# Patient Record
Sex: Female | Born: 1985 | Race: Black or African American | Hispanic: No | Marital: Single | Smoking: Current every day smoker
Health system: Southern US, Community
[De-identification: ages and names within clinical notes are randomized; demographics above are authoritative.]

## PROBLEM LIST (undated history)

## (undated) ENCOUNTER — Inpatient Hospital Stay (HOSPITAL_COMMUNITY): Payer: Self-pay

## (undated) DIAGNOSIS — A749 Chlamydial infection, unspecified: Secondary | ICD-10-CM

## (undated) DIAGNOSIS — D649 Anemia, unspecified: Secondary | ICD-10-CM

## (undated) DIAGNOSIS — F419 Anxiety disorder, unspecified: Secondary | ICD-10-CM

## (undated) DIAGNOSIS — F329 Major depressive disorder, single episode, unspecified: Secondary | ICD-10-CM

## (undated) DIAGNOSIS — Z789 Other specified health status: Secondary | ICD-10-CM

## (undated) DIAGNOSIS — F32A Depression, unspecified: Secondary | ICD-10-CM

## (undated) HISTORY — PX: INDUCED ABORTION: SHX677

---
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
## 2007-01-04 ENCOUNTER — Ambulatory Visit (HOSPITAL_COMMUNITY): Admission: RE | Admit: 2007-01-04 | Discharge: 2007-01-04 | Payer: Self-pay | Admitting: Obstetrics & Gynecology

## 2007-02-15 ENCOUNTER — Emergency Department (HOSPITAL_COMMUNITY): Admission: EM | Admit: 2007-02-15 | Discharge: 2007-02-15 | Payer: Self-pay | Admitting: Emergency Medicine

## 2007-02-18 ENCOUNTER — Inpatient Hospital Stay (HOSPITAL_COMMUNITY): Admission: AD | Admit: 2007-02-18 | Discharge: 2007-02-18 | Payer: Self-pay | Admitting: Obstetrics & Gynecology

## 2007-02-18 ENCOUNTER — Ambulatory Visit: Payer: Self-pay | Admitting: Obstetrics and Gynecology

## 2007-04-17 ENCOUNTER — Inpatient Hospital Stay (HOSPITAL_COMMUNITY): Admission: AD | Admit: 2007-04-17 | Discharge: 2007-04-17 | Payer: Self-pay | Admitting: Obstetrics & Gynecology

## 2007-05-11 ENCOUNTER — Inpatient Hospital Stay (HOSPITAL_COMMUNITY): Admission: AD | Admit: 2007-05-11 | Discharge: 2007-05-11 | Payer: Self-pay | Admitting: Obstetrics & Gynecology

## 2007-05-11 ENCOUNTER — Ambulatory Visit: Payer: Self-pay | Admitting: Advanced Practice Midwife

## 2007-05-16 ENCOUNTER — Inpatient Hospital Stay (HOSPITAL_COMMUNITY): Admission: AD | Admit: 2007-05-16 | Discharge: 2007-05-18 | Payer: Self-pay | Admitting: Obstetrics & Gynecology

## 2007-05-16 ENCOUNTER — Ambulatory Visit: Payer: Self-pay | Admitting: Obstetrics & Gynecology

## 2007-11-29 ENCOUNTER — Emergency Department (HOSPITAL_COMMUNITY): Admission: EM | Admit: 2007-11-29 | Discharge: 2007-11-29 | Payer: Self-pay | Admitting: Emergency Medicine

## 2008-05-24 ENCOUNTER — Emergency Department (HOSPITAL_COMMUNITY): Admission: EM | Admit: 2008-05-24 | Discharge: 2008-05-24 | Payer: Self-pay | Admitting: Emergency Medicine

## 2009-02-07 ENCOUNTER — Emergency Department (HOSPITAL_COMMUNITY): Admission: EM | Admit: 2009-02-07 | Discharge: 2009-02-07 | Payer: Self-pay | Admitting: Emergency Medicine

## 2009-02-22 ENCOUNTER — Emergency Department (HOSPITAL_COMMUNITY): Admission: EM | Admit: 2009-02-22 | Discharge: 2009-02-23 | Payer: Self-pay | Admitting: Emergency Medicine

## 2009-02-27 ENCOUNTER — Emergency Department (HOSPITAL_COMMUNITY): Admission: EM | Admit: 2009-02-27 | Discharge: 2009-02-27 | Payer: Self-pay | Admitting: Emergency Medicine

## 2009-06-08 ENCOUNTER — Emergency Department (HOSPITAL_COMMUNITY): Admission: EM | Admit: 2009-06-08 | Discharge: 2009-06-08 | Payer: Self-pay | Admitting: Emergency Medicine

## 2010-05-23 ENCOUNTER — Emergency Department (HOSPITAL_COMMUNITY): Admission: EM | Admit: 2010-05-23 | Discharge: 2010-05-23 | Payer: Self-pay | Admitting: Emergency Medicine

## 2010-07-09 ENCOUNTER — Emergency Department (HOSPITAL_COMMUNITY): Admission: EM | Admit: 2010-07-09 | Discharge: 2010-07-09 | Payer: Self-pay | Admitting: Emergency Medicine

## 2010-08-09 ENCOUNTER — Emergency Department (HOSPITAL_COMMUNITY): Admission: EM | Admit: 2010-08-09 | Discharge: 2010-08-09 | Payer: Self-pay | Admitting: Family Medicine

## 2010-08-21 ENCOUNTER — Emergency Department (HOSPITAL_COMMUNITY)
Admission: EM | Admit: 2010-08-21 | Discharge: 2010-08-21 | Payer: Self-pay | Source: Home / Self Care | Admitting: Emergency Medicine

## 2010-08-25 ENCOUNTER — Emergency Department (HOSPITAL_COMMUNITY)
Admission: EM | Admit: 2010-08-25 | Discharge: 2010-08-25 | Payer: Self-pay | Source: Home / Self Care | Admitting: Emergency Medicine

## 2010-12-03 LAB — POCT URINALYSIS DIPSTICK
Bilirubin Urine: NEGATIVE
Ketones, ur: NEGATIVE mg/dL
Nitrite: NEGATIVE
Specific Gravity, Urine: 1.01 (ref 1.005–1.030)
Urobilinogen, UA: 1 mg/dL (ref 0.0–1.0)
pH: >=9 (ref 5.0–8.0)

## 2010-12-03 LAB — GC/CHLAMYDIA PROBE AMP, GENITAL: GC Probe Amp, Genital: POSITIVE — AB

## 2010-12-03 LAB — POCT PREGNANCY, URINE: Preg Test, Ur: NEGATIVE

## 2010-12-03 LAB — WET PREP, GENITAL

## 2010-12-04 LAB — POCT I-STAT, CHEM 8
BUN: 9 mg/dL (ref 6–23)
Calcium, Ion: 1.17 mmol/L (ref 1.12–1.32)
Chloride: 106 mEq/L (ref 96–112)
Creatinine, Ser: 1 mg/dL (ref 0.4–1.2)
Glucose, Bld: 88 mg/dL (ref 70–99)
HCT: 41 % (ref 36.0–46.0)
Hemoglobin: 13.9 g/dL (ref 12.0–15.0)
Potassium: 3.7 mEq/L (ref 3.5–5.1)
Sodium: 139 mEq/L (ref 135–145)
TCO2: 25 mmol/L (ref 0–100)

## 2010-12-04 LAB — CBC
HCT: 35 % — ABNORMAL LOW (ref 36.0–46.0)
MCH: 29.5 pg (ref 26.0–34.0)
MCV: 88.4 fL (ref 78.0–100.0)
Platelets: 270 10*3/uL (ref 150–400)

## 2010-12-04 LAB — POCT PREGNANCY, URINE: Preg Test, Ur: NEGATIVE

## 2010-12-04 LAB — DIFFERENTIAL
Basophils Absolute: 0 10*3/uL (ref 0.0–0.1)
Eosinophils Absolute: 0.1 10*3/uL (ref 0.0–0.7)
Eosinophils Relative: 3 % (ref 0–5)
Lymphs Abs: 2 10*3/uL (ref 0.7–4.0)
Monocytes Absolute: 0.4 10*3/uL (ref 0.1–1.0)
Neutrophils Relative %: 53 % (ref 43–77)

## 2010-12-30 LAB — URINE MICROSCOPIC-ADD ON

## 2010-12-30 LAB — COMPREHENSIVE METABOLIC PANEL
ALT: 18 U/L (ref 0–35)
AST: 25 U/L (ref 0–37)
Alkaline Phosphatase: 57 U/L (ref 39–117)
BUN: 11 mg/dL (ref 6–23)
CO2: 21 mEq/L (ref 19–32)
GFR calc Af Amer: >60 mL/min (ref >60)
GFR calc non Af Amer: >60 mL/min (ref >60)
Glucose, Bld: 87 mg/dL (ref 70–99)
Potassium: 3.2 mEq/L — ABNORMAL LOW (ref 3.5–5.1)
Sodium: 139 mEq/L (ref 135–145)
Total Bilirubin: 0.7 mg/dL (ref 0.3–1.2)
Total Protein: 6.8 g/dL (ref 6.0–8.3)

## 2010-12-30 LAB — LIPASE, BLOOD: Lipase: 12 U/L (ref 11–59)

## 2010-12-30 LAB — CBC
HCT: 39.8 % (ref 36.0–46.0)
Hemoglobin: 13.3 g/dL (ref 12.0–15.0)
RBC: 4.48 MIL/uL (ref 3.87–5.11)

## 2010-12-30 LAB — DIFFERENTIAL
Basophils Absolute: 0 10*3/uL (ref 0.0–0.1)
Basophils Relative: 0 % (ref 0–1)
Eosinophils Absolute: 0 10*3/uL (ref 0.0–0.7)
Neutrophils Relative %: 89 % — ABNORMAL HIGH (ref 43–77)

## 2010-12-30 LAB — URINALYSIS, ROUTINE W REFLEX MICROSCOPIC
Glucose, UA: NEGATIVE mg/dL
Leukocytes, UA: NEGATIVE
Nitrite: NEGATIVE

## 2011-02-07 NOTE — Consult Note (Signed)
NAME:  Joanna Melton, Joanna Melton NO.:  [id number]   MEDICAL RECORD NO.:  [id number]          PATIENT TYPE:  EMS   LOCATION:  [facility]                         FACILITY:  [facility]   PHYSICIAN:  Roseanna Rainbow, M.D.DATE OF BIRTH:  Sep 13, 1986   DATE OF CONSULTATION:  09/30/2005  DATE OF DISCHARGE:  09/30/2005                                   CONSULTATION   REFERRING PHYSICIAN:  Tinnie Gens P. Caporossi, MD   REASON FOR CONSULTATION:  Asked to consult on this 25 year old African  American female, gravida 1, para 0, with rule-out ectopic pregnancy.   HISTORY OF PRESENT ILLNESS:  The patient's last menstrual period was on  August 24, 2005.  She presented to the [facility] Emergency Department with  complaints of lower abdominal cramping, suprapubic, earlier today.  She  denies any pain at present.  She has a history of gonorrhea.  Workup to date  included a quantitative beta hCG that was 739, a hemoglobin of 13 and an  ultrasound that demonstrated small free fluid in the posterior cul-de-sac,  no intrauterine pregnancy and a possible small hypoechoic mass near the left  ovary.   PAST OBSTETRICAL/GYNECOLOGICAL HISTORY:  Please see the above.   PAST MEDICAL HISTORY:  Noncontributory.   SOCIAL HISTORY:  She reports tobacco use of 1-2 packs per week.   FAMILY HISTORY:  Noncontributory.   PHYSICAL EXAMINATION:  VITAL SIGNS:  Temperature 99.2 degrees, blood  pressure 130/76, heart rate 106, respirations 20.  GENERAL:  A thin African American female in no apparent distress.  ABDOMEN:  Soft, nontender and non-distended.   ASSESSMENT:  Adolescent nullipara with an early pregnancy.  Differential  diagnosis includes a normal pregnancy, possible nonviable pregnancy,  possible ectopic pregnancy, risk factors including tobacco use and a  previous sexually transmitted infection.  She is hemodynamically stable.   PLAN:  I recommend discharge to home with pelvic rest, serial beta hCGs,  labs, with a repeat ultrasound when the beta hCG is at least 1500-2000 IU.  Ectopic pregnancy precautions.      Roseanna Rainbow, M.D.  Electronically Signed    LAJ/MEDQ  D:  09/30/2005  T:  10/01/2005  Job:  [id number]

## 2011-06-06 ENCOUNTER — Emergency Department (HOSPITAL_COMMUNITY)
Admission: EM | Admit: 2011-06-06 | Discharge: 2011-06-06 | Disposition: A | Discharge status: Home / Self Care | Payer: Self-pay | Attending: Emergency Medicine | Admitting: Emergency Medicine

## 2011-06-06 DIAGNOSIS — L2989 Other pruritus: Secondary | ICD-10-CM | POA: Insufficient documentation

## 2011-06-06 DIAGNOSIS — B86 Scabies: Secondary | ICD-10-CM | POA: Insufficient documentation

## 2011-06-06 DIAGNOSIS — R21 Rash and other nonspecific skin eruption: Secondary | ICD-10-CM | POA: Insufficient documentation

## 2011-06-06 DIAGNOSIS — L298 Other pruritus: Secondary | ICD-10-CM | POA: Insufficient documentation

## 2011-06-06 DIAGNOSIS — L259 Unspecified contact dermatitis, unspecified cause: Secondary | ICD-10-CM | POA: Insufficient documentation

## 2011-06-25 LAB — URINALYSIS, ROUTINE W REFLEX MICROSCOPIC
Ketones, ur: NEGATIVE
Leukocytes, UA: NEGATIVE
Nitrite: NEGATIVE
pH: 6.5

## 2011-06-25 LAB — WET PREP, GENITAL: WBC, Wet Prep HPF POC: NONE SEEN

## 2011-06-25 LAB — POCT PREGNANCY, URINE: Preg Test, Ur: NEGATIVE

## 2011-06-25 LAB — RPR: RPR Ser Ql: NONREACTIVE

## 2011-06-25 LAB — URINE MICROSCOPIC-ADD ON

## 2011-07-04 LAB — GC/CHLAMYDIA PROBE AMP, URINE: Chlamydia, Swab/Urine, PCR: NEGATIVE

## 2011-07-04 LAB — CBC
HCT: 32.7 — ABNORMAL LOW
Platelets: 232
WBC: 10.8 — ABNORMAL HIGH

## 2011-07-04 LAB — WET PREP, GENITAL

## 2011-07-04 LAB — RPR: RPR Ser Ql: NONREACTIVE

## 2011-07-07 LAB — STREP B DNA PROBE

## 2011-07-30 ENCOUNTER — Emergency Department (HOSPITAL_COMMUNITY): Payer: Self-pay

## 2011-07-30 ENCOUNTER — Emergency Department (HOSPITAL_COMMUNITY)
Admission: EM | Admit: 2011-07-30 | Discharge: 2011-07-30 | Disposition: A | Discharge status: Home / Self Care | Payer: Self-pay | Attending: Emergency Medicine | Admitting: Emergency Medicine

## 2011-07-30 DIAGNOSIS — S43016A Anterior dislocation of unspecified humerus, initial encounter: Secondary | ICD-10-CM

## 2011-07-30 DIAGNOSIS — M24419 Recurrent dislocation, unspecified shoulder: Secondary | ICD-10-CM | POA: Insufficient documentation

## 2011-07-30 DIAGNOSIS — M25519 Pain in unspecified shoulder: Secondary | ICD-10-CM | POA: Insufficient documentation

## 2011-07-30 MED ORDER — ONDANSETRON HCL 4 MG/2ML IJ SOLN
4.0000 mg | Freq: Once | INTRAMUSCULAR | Status: AC
  Administered 2011-07-30: 4 mg via INTRAVENOUS
  Filled 2011-07-30: qty 2

## 2011-07-30 MED ORDER — SODIUM CHLORIDE 0.9 % IV SOLN
Freq: Once | INTRAVENOUS | Status: AC
  Administered 2011-07-30: 1000 mL via INTRAVENOUS

## 2011-07-30 MED ORDER — PROPRANOLOL HCL 1 MG/ML IV SOLN
50.0000 mg | Freq: Once | INTRAVENOUS | Status: DC
  Filled 2011-07-30: qty 50

## 2011-07-30 MED ORDER — MORPHINE SULFATE 4 MG/ML IJ SOLN
6.0000 mg | Freq: Once | INTRAMUSCULAR | Status: AC
  Administered 2011-07-30: 6 mg via INTRAVENOUS
  Filled 2011-07-30: qty 2

## 2011-07-30 MED ORDER — PROPOFOL 10 MG/ML IV EMUL
INTRAVENOUS | Status: AC
  Administered 2011-07-30: 200 mg via INTRAVENOUS
  Filled 2011-07-30: qty 20

## 2011-07-30 MED ORDER — PROPOFOL 10 MG/ML IV EMUL
50.0000 mg | Freq: Once | INTRAVENOUS | Status: AC
  Administered 2011-07-30: 200 mg via INTRAVENOUS

## 2011-07-30 MED ORDER — HYDROCODONE-ACETAMINOPHEN 5-500 MG PO TABS: 1.0000 | ORAL_TABLET | Freq: Four times a day (QID) | ORAL | Status: AC | PRN

## 2011-07-30 MED ORDER — SODIUM CHLORIDE 0.9 % IV SOLN
INTRAVENOUS | Status: DC
  Administered 2011-07-30: 1000 mL via INTRAVENOUS

## 2011-07-30 MED ORDER — SODIUM CHLORIDE 0.9 % IV SOLN
INTRAVENOUS | Status: DC | PRN
  Administered 2011-07-30: 1000 mL via INTRAVENOUS

## 2011-07-30 NOTE — ED Notes (Signed)
Ortho has placed left shoulder in sling.

## 2011-07-30 NOTE — ED Notes (Signed)
Pt presents with L shoulder pain after awakening with pain this morning. Pt reports h/o mulitple shoulder dislocations.  PIV established PTA to R FA , pt received fentanyl IVP,  LUE splinted PTA.

## 2011-07-30 NOTE — ED Notes (Signed)
Pt presents with L shoulder pain and deformity after awakening with pain this morning.  Deformity noted to top of shoulder, +radial pulse, pt unable to move arm; pt reports h/o multiple dislocations.

## 2011-07-30 NOTE — ED Notes (Signed)
Dr Hyman Hopes remains at bedside, orders received for 1000cc bolus for blood pressure

## 2011-07-30 NOTE — ED Notes (Signed)
Pt received from xray. Pt reports hx of mult dislocations to left shoulder, pt reports increase in pain at this time because of xrays. Pt is aware of poc. Will given pt swap for dry mouth. Pulse good. Pt able to move left fingers. Pt has fallen during hanging of the fluids.

## 2011-07-30 NOTE — Discharge Instructions (Signed)
Shoulder Dislocation Shoulder dislocations are an injury to the shoulder joint in which the shoulder has been forced out of the socket. This may result in injury to muscles and ligaments around this joint. HOME CARE INSTRUCTIONS   If a sling or splint was applied, wear as directed. It can be taken off to bathe unless directed otherwise.   Apply ice bags (ice in a plastic bag with a towel around it to prevent frostbite to skin) about 3 to 4 times a day for 15 to 20 minutes, while awake, to the injured area for the first 24 hours, then as directed by your caregiver.   Only take over-the-counter or prescription medicines for pain, discomfort, or fever as directed by your caregiver.  Persistent pain and inability to use the injured area for more than 2 to 3 days are warning signs indicating that you should see a caregiver for a follow-up visit as soon as possible. Initially, a hairline fracture (this is the same as a broken bone) may not be evident on X-rays. Persistent pain and swelling indicate that further evaluation and/or further X-rays may be indicated. X-rays sometimes do not show a small fracture until a week to ten days later. Make a follow-up appointment with your own caregiver or one to whom we have referred you, it is very important to keep that appointment. Not keeping the appointment could result in a chronic or permanent injury, pain, and disability. If there is any problem keeping the appointment, you must call back to this facility for assistance. A radiologist (a specialist in reading X-rays) will re-read your x-rays. Make sure you know how you are to obtain your x-ray results. Do not assume everything is normal if you do not hear from [place]. SEEK IMMEDIATE MEDICAL CARE IF:  You develop severe swelling, bluish discoloration, coldness, numbness, or excessive pain in the shoulder, arm, or hand. MAKE SURE YOU:   Understand these instructions.   Will watch your condition.   Will get help  right away if you are not doing well or get worse.  Document Released: 06/03/2001 Document Revised: 05/21/2011 Document Reviewed: 04/13/2008 [facility] Patient Information 2012 [place], [place].

## 2011-07-30 NOTE — ED Provider Notes (Addendum)
History     CSN: [id number] Arrival date & time: 07/30/2011 12:23 PM   First MD Initiated Contact with Patient 07/30/11 1232      Chief Complaint  Patient presents with  . Shoulder Pain    (Consider location/radiation/quality/duration/timing/severity/associated sxs/prior treatment) HPI  History reviewed. No pertinent past medical history.  History reviewed. No pertinent past surgical history.  History reviewed. No pertinent family history.  History  Substance Use Topics  . Smoking status: Not on file  . Smokeless tobacco: Not on file  . Alcohol Use: No    OB History    Grav Para Term Preterm Abortions TAB SAB Ect Mult Living                  Review of Systems  Allergies  Review of patient's allergies indicates no known allergies.  Home Medications  No current outpatient prescriptions on file.  BP 106/69  Pulse 59  Temp(Src) 98 F (36.7 C) (Oral)  Resp 16  SpO2 98%  LMP 07/20/2011  Physical Exam  ED Course  Procedures (including critical care time)  Labs Reviewed - No data to display No results found.   No diagnosis found.    MDM  Medical screening exam :Patient with recurrent shoulder dislocations. Awakened today with left shoulder pain. On exam alert awake Glasgow Coma Score 15 deformity of left shoulder. She is unable to move the shoulder in any direction due to pain. Radial pulse 2+ good sensation of the shoulder Emblem area        Doug Sou, MD 07/30/11 1241  Doug Sou, MD 07/30/11 1245

## 2011-08-03 IMAGING — CR DG SHOULDER 2+V*L*
1 series · 1 of 1 positions shown · non-contrast
Comparison: Two-view left shoulder of the same day at [DATE] p.m.

CLINICAL DATA: Status post reduction.

LEFT SHOULDER - 2+ VIEW

[view not recorded]
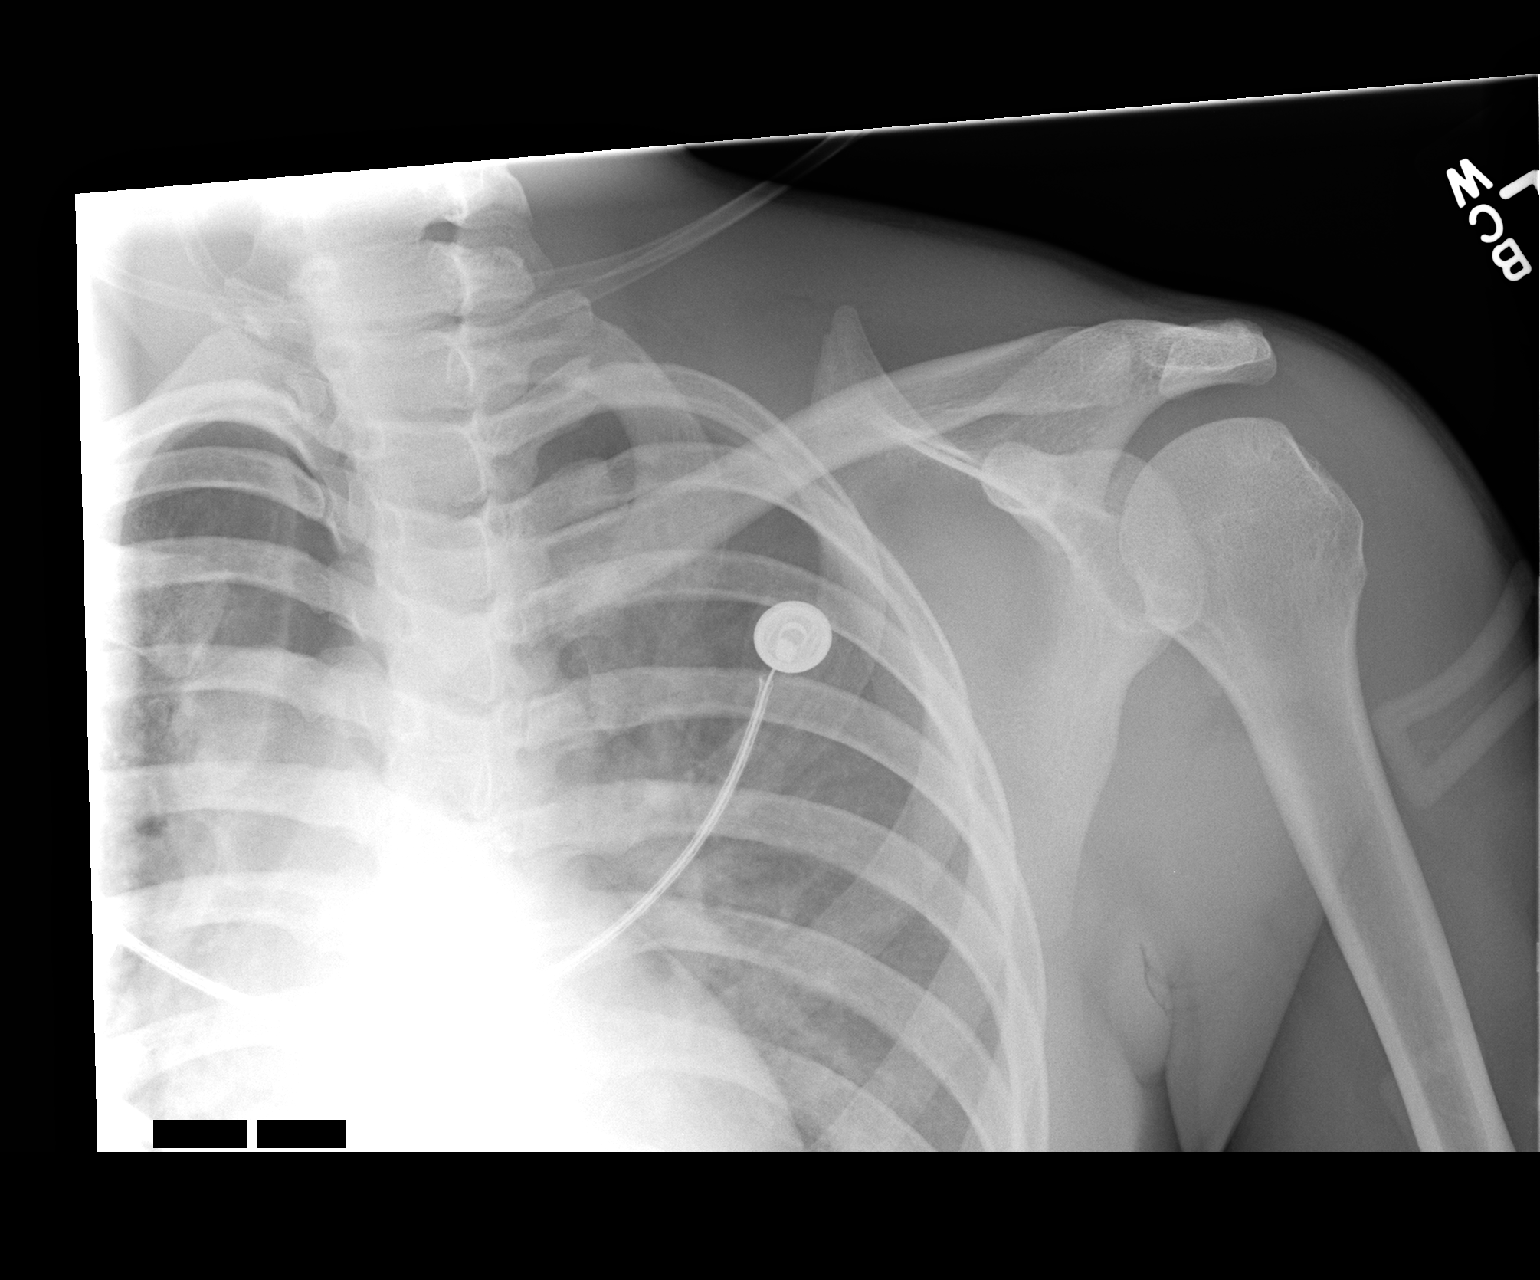

[1 of 1 positions shown; findings below may reference images not displayed]

FINDINGS: The left shoulder is located.  A chronic Hill-Sachs
lesion is suspected.  The lung volumes are low.
IMPRESSION: 1.  Status post reduction of the left shoulder.
2.  Suspect chronic Hill-Sachs fracture.

## 2011-08-07 IMAGING — CR DG SHOULDER 1V*L*
1 series · 1 of 1 positions shown · non-contrast
Comparison: 08/21/2010 radiographs.

CLINICAL DATA: Shoulder dislocation post reduction.

PORTABLE LEFT SHOULDER - 2+ VIEW

[ap int/ext rotation]
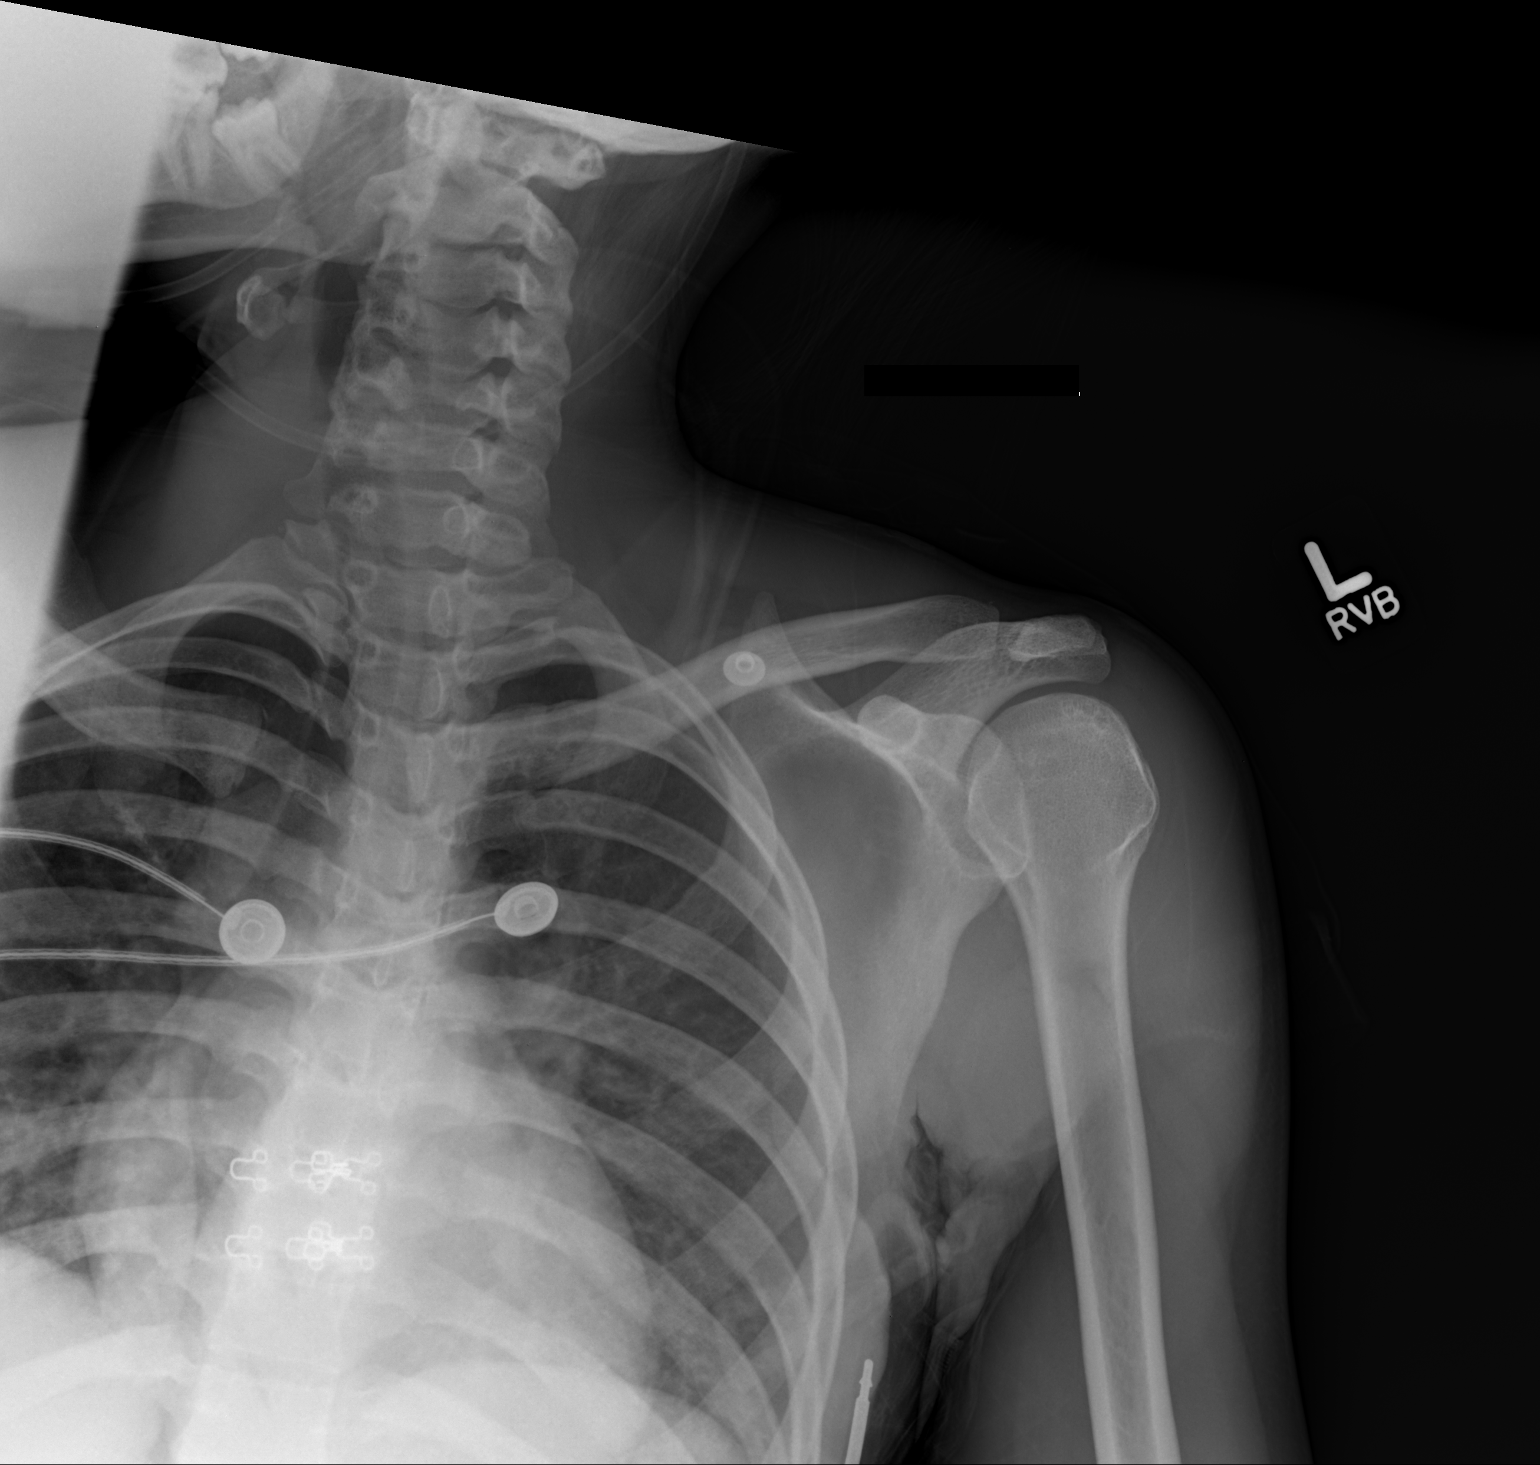

[1 of 1 positions shown; findings below may reference images not displayed]

FINDINGS: There is no evidence of glenohumeral dislocation on
single AP view.  Chronic Hill-Sachs deformity appears unchanged.
There are cystic changes laterally in the humeral head.
IMPRESSION: No demonstrated dislocation (reduced per clinical history).
Chronic Hill-Sachs deformity.

## 2011-09-05 ENCOUNTER — Emergency Department (HOSPITAL_COMMUNITY)
Admission: EM | Admit: 2011-09-05 | Discharge: 2011-09-05 | Disposition: A | Discharge status: Home / Self Care | Payer: Self-pay | Attending: Emergency Medicine | Admitting: Emergency Medicine

## 2011-09-05 ENCOUNTER — Encounter (HOSPITAL_COMMUNITY): Payer: Self-pay

## 2011-09-05 DIAGNOSIS — R109 Unspecified abdominal pain: Secondary | ICD-10-CM | POA: Insufficient documentation

## 2011-09-05 DIAGNOSIS — R6883 Chills (without fever): Secondary | ICD-10-CM | POA: Insufficient documentation

## 2011-09-05 DIAGNOSIS — R21 Rash and other nonspecific skin eruption: Secondary | ICD-10-CM | POA: Insufficient documentation

## 2011-09-05 DIAGNOSIS — F17200 Nicotine dependence, unspecified, uncomplicated: Secondary | ICD-10-CM | POA: Insufficient documentation

## 2011-09-05 LAB — URINALYSIS, ROUTINE W REFLEX MICROSCOPIC
Glucose, UA: NEGATIVE mg/dL
Ketones, ur: 15 mg/dL — AB
Leukocytes, UA: NEGATIVE
Specific Gravity, Urine: 1.029 (ref 1.005–1.030)
pH: 6 (ref 5.0–8.0)

## 2011-09-05 LAB — WET PREP, GENITAL
Clue Cells Wet Prep HPF POC: NONE SEEN
Trich, Wet Prep: NONE SEEN
Yeast Wet Prep HPF POC: NONE SEEN

## 2011-09-05 LAB — URINE MICROSCOPIC-ADD ON

## 2011-09-05 LAB — RPR: RPR Ser Ql: NONREACTIVE

## 2011-09-05 LAB — POCT PREGNANCY, URINE: Preg Test, Ur: NEGATIVE

## 2011-09-05 MED ORDER — AZITHROMYCIN 1 G PO PACK
1.0000 g | PACK | Freq: Once | ORAL | Status: AC
  Administered 2011-09-05: 1 g via ORAL
  Filled 2011-09-05: qty 1

## 2011-09-05 MED ORDER — CEFTRIAXONE SODIUM 250 MG IJ SOLR
250.0000 mg | Freq: Once | INTRAMUSCULAR | Status: AC
  Administered 2011-09-05: 250 mg via INTRAMUSCULAR
  Filled 2011-09-05: qty 250

## 2011-09-05 NOTE — ED Provider Notes (Signed)
History     CSN: [id number] Arrival date & time: 09/05/2011 10:42 AM   First MD Initiated Contact with Patient 09/05/11 1101      Chief Complaint  Patient presents with  . Abdominal Pain  . Rash     Patient is a 25 y.o. female presenting with abdominal pain. The history is provided by the patient.  Abdominal Pain The primary symptoms of the illness include abdominal pain. The primary symptoms of the illness do not include fever. The current episode started more than 2 days ago. The onset of the illness was gradual. The problem has not changed since onset. Additional symptoms associated with the illness include chills. Symptoms associated with the illness do not include urgency, frequency or back pain.  Pt reports abd pain for about 2 weeks, no menstrual cycle since late October Has had negative preg test at home No significant vag bleeding reported Also reports chronic rash to her upper extremities No dysuria   PMH - none  No past surgical history on file.  No family history on file.  History  Substance Use Topics  . Smoking status: Current Everyday Smoker -- 0.5 packs/day for 10 years    Types: Cigarettes  . Smokeless tobacco: Not on file  . Alcohol Use: Yes     occasional    OB History    Grav Para Term Preterm Abortions TAB SAB Ect Mult Living   2 2              Review of Systems  Constitutional: Positive for chills. Negative for fever.  Gastrointestinal: Positive for abdominal pain.  Genitourinary: Negative for urgency and frequency.  Musculoskeletal: Negative for back pain.  All other systems reviewed and are negative.    Allergies  Review of patient's allergies indicates no known allergies.  Home Medications   Current Outpatient Rx  Name Route Sig Dispense Refill  . OVER THE COUNTER MEDICATION Both Eyes Place 1 drop into both eyes daily as needed. Restasis    For watery eyes       LMP 07/16/2011 LMP 07/16/2011  Physical Exam BP 117/77   Pulse 61  Temp(Src) 98.3 F (36.8 C) (Oral)  SpO2 100%  LMP 07/16/2011  CONSTITUTIONAL: Well developed/well nourished HEAD AND FACE: Normocephalic/atraumatic EYES: EOMI/PERRL ENMT: Mucous membranes moist NECK: supple no meningeal signs SPINE:entire spine nontender CV: S1/S2 noted, no murmurs/rubs/gallops noted LUNGS: Lungs are clear to auscultation bilaterally, no apparent distress ABDOMEN: soft, nontender, no rebound or guarding GU:no cva tenderness No vag bleeding, minimal vag discharge, no cmt, no adnexal tenderness Chaperone present NEURO: Pt is awake/alert, moves all extremitiesx4 EXTREMITIES: pulses normal, full ROM, rash noted to right Ue, no erythema/drainage/petechiae noted SKIN: warm, color normal PSYCH: no abnormalities of mood noted   ED Course  Procedures   Labs Reviewed  URINALYSIS, ROUTINE W REFLEX MICROSCOPIC  POCT PREGNANCY, URINE  GC/CHLAMYDIA PROBE AMP, GENITAL  RPR   11:38 AM Pt well appearing Will check urine, will also send RPR for chronic rash abd soft/nontender at this time  12:11 PM Pt is not sure if she got treated last time for gc/cham and reports recent unprotected sex Will treat here for gc/chlam rpr pending   MDM  Nursing notes reviewed and considered in documentation All labs/vitals reviewed and considered Previous records reviewed and considered         Joya Gaskins, MD 09/05/11 1211

## 2011-09-05 NOTE — ED Notes (Signed)
Pt states she has not had a period since the end of October and has left lower quad abd/pelvic pain. Pt states she has some pink or "burgandy" discharge when she wipes but has not had a period. Pt is requesting pregnancy test and std check after having unprotected sex a month ago

## 2011-09-06 LAB — GC/CHLAMYDIA PROBE AMP, GENITAL: Chlamydia, DNA Probe: NEGATIVE

## 2012-01-22 ENCOUNTER — Encounter (HOSPITAL_COMMUNITY): Payer: Self-pay

## 2012-01-22 ENCOUNTER — Emergency Department (HOSPITAL_COMMUNITY)
Admission: EM | Admit: 2012-01-22 | Discharge: 2012-01-22 | Disposition: A | Discharge status: Home Health | Payer: Self-pay | Attending: Emergency Medicine | Admitting: Emergency Medicine

## 2012-01-22 DIAGNOSIS — B9689 Other specified bacterial agents as the cause of diseases classified elsewhere: Secondary | ICD-10-CM | POA: Insufficient documentation

## 2012-01-22 DIAGNOSIS — Z202 Contact with and (suspected) exposure to infections with a predominantly sexual mode of transmission: Secondary | ICD-10-CM | POA: Insufficient documentation

## 2012-01-22 DIAGNOSIS — F17200 Nicotine dependence, unspecified, uncomplicated: Secondary | ICD-10-CM | POA: Insufficient documentation

## 2012-01-22 DIAGNOSIS — A499 Bacterial infection, unspecified: Secondary | ICD-10-CM | POA: Insufficient documentation

## 2012-01-22 DIAGNOSIS — R10819 Abdominal tenderness, unspecified site: Secondary | ICD-10-CM | POA: Insufficient documentation

## 2012-01-22 DIAGNOSIS — N760 Acute vaginitis: Secondary | ICD-10-CM | POA: Insufficient documentation

## 2012-01-22 DIAGNOSIS — R109 Unspecified abdominal pain: Secondary | ICD-10-CM | POA: Insufficient documentation

## 2012-01-22 DIAGNOSIS — Z711 Person with feared health complaint in whom no diagnosis is made: Secondary | ICD-10-CM

## 2012-01-22 LAB — BASIC METABOLIC PANEL
BUN: 9 mg/dL (ref 6–23)
CO2: 26 mEq/L (ref 19–32)
Calcium: 9.6 mg/dL (ref 8.4–10.5)
GFR calc non Af Amer: 82 mL/min — ABNORMAL LOW (ref >90)
Glucose, Bld: 79 mg/dL (ref 70–99)

## 2012-01-22 LAB — CBC
HCT: 38.5 % (ref 36.0–46.0)
Hemoglobin: 13 g/dL (ref 12.0–15.0)
MCH: 29.7 pg (ref 26.0–34.0)
MCHC: 33.8 g/dL (ref 30.0–36.0)
MCV: 87.9 fL (ref 78.0–100.0)
RBC: 4.38 MIL/uL (ref 3.87–5.11)

## 2012-01-22 LAB — URINALYSIS, ROUTINE W REFLEX MICROSCOPIC
Glucose, UA: NEGATIVE mg/dL
Ketones, ur: NEGATIVE mg/dL
Leukocytes, UA: NEGATIVE
Protein, ur: NEGATIVE mg/dL
pH: 6 (ref 5.0–8.0)

## 2012-01-22 LAB — URINE MICROSCOPIC-ADD ON

## 2012-01-22 LAB — WET PREP, GENITAL

## 2012-01-22 MED ORDER — CEFTRIAXONE SODIUM 250 MG IJ SOLR
250.0000 mg | Freq: Once | INTRAMUSCULAR | Status: AC
  Administered 2012-01-22: 250 mg via INTRAMUSCULAR
  Filled 2012-01-22: qty 250

## 2012-01-22 MED ORDER — METRONIDAZOLE 500 MG PO TABS: 500.0000 mg | ORAL_TABLET | Freq: Two times a day (BID) | ORAL | Status: AC

## 2012-01-22 MED ORDER — AZITHROMYCIN 250 MG PO TABS
1000.0000 mg | ORAL_TABLET | Freq: Once | ORAL | Status: AC
  Administered 2012-01-22: 1000 mg via ORAL
  Filled 2012-01-22: qty 4

## 2012-01-22 NOTE — ED Notes (Signed)
Pt presents with foul vaginal odor, white vaginal discharge and suprapubic pain x 4 days.  Pt admits to unprotected sex with onset of symptoms x 4 days ago.

## 2012-01-22 NOTE — ED Provider Notes (Signed)
History     CSN: [id number]  Arrival date & time 01/22/12  1354   First MD Initiated Contact with Patient 01/22/12 1631      Chief Complaint  Patient presents with  . Abdominal Pain    (Consider location/radiation/quality/duration/timing/severity/associated sxs/prior treatment) HPI  Patient presents to the ED with complaints of suprapubic pain for 1 week, vaginal odor and discharge for 4 days. The discharge is white and thick. The patient is concerned that she may have an STD. She has been having unprotected sex with her "babies daddy". She denies nausea, vomiting, chills, weakness. The pain is not constant but is intermittent. She is not currently having pain but when she does it is crampy. Her LMP was April 8.  History reviewed. No pertinent past medical history.  History reviewed. No pertinent past surgical history.  No family history on file.  History  Substance Use Topics  . Smoking status: Current Everyday Smoker -- 0.5 packs/day for 10 years    Types: Cigarettes  . Smokeless tobacco: Not on file  . Alcohol Use: Yes     occasional    OB History    Grav Para Term Preterm Abortions TAB SAB Ect Mult Living   2 2              Review of Systems   HEENT: denies blurry vision or change in hearing PULMONARY: Denies difficulty breathing and SOB CARDIAC: denies chest pain or heart palpitations MUSCULOSKELETAL:  denies being unable to ambulate ABDOMEN AL: denies abdominal pain GU: denies loss of bowel or urinary control NEURO: denies numbness and tingling in extremities   Allergies  Review of patient's allergies indicates no known allergies.  Home Medications  No current outpatient prescriptions on file.  BP 127/90  Pulse 84  Temp(Src) 98.2 F (36.8 C) (Oral)  Resp 20  SpO2 100%  LMP 12/29/2011  Physical Exam  Nursing note and vitals reviewed. Constitutional: She appears well-developed and well-nourished. No distress.  HENT:  Head: Normocephalic and  atraumatic.  Eyes: Pupils are equal, round, and reactive to light.  Neck: Normal range of motion. Neck supple.  Cardiovascular: Normal rate and regular rhythm.   Pulmonary/Chest: Effort normal.  Abdominal: Soft. She exhibits no distension. There is tenderness (suprapubic tenderness to palpation). There is no rebound and no guarding.  Genitourinary: Uterus normal. Cervix exhibits no motion tenderness, no discharge and no friability. Right adnexum displays no mass and no tenderness. Left adnexum displays no mass and no tenderness. No erythema, tenderness or bleeding around the vagina. No foreign body around the vagina. No signs of injury around the vagina. Vaginal discharge found.  Neurological: She is alert.  Skin: Skin is warm and dry.    ED Course  Procedures (including critical care time)  Labs Reviewed  WET PREP, GENITAL - Abnormal; Notable for the following:    Clue Cells Wet Prep HPF POC FEW (*)    All other components within normal limits  URINALYSIS, ROUTINE W REFLEX MICROSCOPIC - Abnormal; Notable for the following:    APPearance HAZY (*)    Specific Gravity, Urine >1.030 (*)    Hgb urine dipstick MODERATE (*)    All other components within normal limits  CBC  PREGNANCY, URINE  URINE MICROSCOPIC-ADD ON  GC/CHLAMYDIA PROBE AMP, GENITAL  BASIC METABOLIC PANEL   No results found.   1. Concern about STD in female without diagnosis   2. Bacterial vaginosis       MDM  Patients pelvic showed  some discharge but no adnexal or uterine tenderness. The patient treated with Rocephin and Azithromycin in ED. Informed she will get a phone call within the week if her cultures for GC come back positive. I am not concerned for PUD; no fever, chills, nausea vomiting or tenderness during pelvic exam.   Wet prep shows clue cells. Patient given Rx for Flagyl. Pt to follow-up with Gynecologist within the next 1-2 weeks to ensure resolution of infection.  Pt has been advised of the  symptoms that warrant their return to the ED. Patient has voiced understanding and has agreed to follow-up with the PCP or specialist.      Dorthula Matas, PA 01/22/12 1754

## 2012-01-22 NOTE — Discharge Instructions (Signed)
Bacterial Vaginosis Bacterial vaginosis (BV) is a vaginal infection where the normal balance of bacteria in the vagina is disrupted. The normal balance is then replaced by an overgrowth of certain bacteria. There are several different kinds of bacteria that can cause BV. BV is the most common vaginal infection in women of childbearing age. CAUSES   The cause of BV is not fully understood. BV develops when there is an increase or imbalance of harmful bacteria.   Some activities or behaviors can upset the normal balance of bacteria in the vagina and put women at increased risk including:   Having a new sex partner or multiple sex partners.   Douching.   Using an intrauterine device (IUD) for contraception.   It is not clear what role sexual activity plays in the development of BV. However, women that have never had sexual intercourse are rarely infected with BV.  Women do not get BV from toilet seats, bedding, swimming pools or from touching objects around them.  SYMPTOMS   Grey vaginal discharge.   A fish-like odor with discharge, especially after sexual intercourse.   Itching or burning of the vagina and vulva.   Burning or pain with urination.   Some women have no signs or symptoms at all.  DIAGNOSIS  Your caregiver must examine the vagina for signs of BV. Your caregiver will perform lab tests and look at the sample of vaginal fluid through a microscope. They will look for bacteria and abnormal cells (clue cells), a pH test higher than 4.5, and a positive amine test all associated with BV.  RISKS AND COMPLICATIONS   Pelvic inflammatory disease (PID).   Infections following gynecology surgery.   Developing HIV.   Developing herpes virus.  TREATMENT  Sometimes BV will clear up without treatment. However, all women with symptoms of BV should be treated to avoid complications, especially if gynecology surgery is planned. Female partners generally do not need to be treated. However,  BV may spread between female sex partners so treatment is helpful in preventing a recurrence of BV.   BV may be treated with antibiotics. The antibiotics come in either pill or vaginal cream forms. Either can be used with nonpregnant or pregnant women, but the recommended dosages differ. These antibiotics are not harmful to the baby.   BV can recur after treatment. If this happens, a second round of antibiotics will often be prescribed.   Treatment is important for pregnant women. If not treated, BV can cause a premature delivery, especially for a pregnant woman who had a premature birth in the past. All pregnant women who have symptoms of BV should be checked and treated.   For chronic reoccurrence of BV, treatment with a type of prescribed gel vaginally twice a week is helpful.  HOME CARE INSTRUCTIONS   Finish all medication as directed by your caregiver.   Do not have sex until treatment is completed.   Tell your sexual partner that you have a vaginal infection. They should see their caregiver and be treated if they have problems, such as a mild rash or itching.   Practice safe sex. Use condoms. Only have 1 sex partner.  PREVENTION  Basic prevention steps can help reduce the risk of upsetting the natural balance of bacteria in the vagina and developing BV:  Do not have sexual intercourse (be abstinent).   Do not douche.   Use all of the medicine prescribed for treatment of BV, even if the signs and symptoms go away.     Tell your sex partner if you have BV. That way, they can be treated, if needed, to prevent reoccurrence.  SEEK MEDICAL CARE IF:   Your symptoms are not improving after 3 days of treatment.   You have increased discharge, pain, or fever.  MAKE SURE YOU:   Understand these instructions.   Will watch your condition.   Will get help right away if you are not doing well or get worse.  FOR MORE INFORMATION  Division of STD Prevention (DSTDP), Centers for Disease  Control and Prevention: SolutionApps.[employer].za American Social Health Association (ASHA): www.ashastd.org  Document Released: 09/08/2005 Document Revised: 08/28/2011 Document Reviewed: 03/01/2009 [facility] Patient Information 2012 [place], [place].Vaginitis Vaginitis is an infection. It causes soreness, swelling, and redness (inflammation) of the vagina. Many of these infections are sexually transmitted diseases (STDs). Having unprotected sex can cause further problems and complications such as:  Chronic pelvic pain.   Infertility.   Unwanted pregnancy.   Abortion.   Tubal pregnancy.   Infection passed on to the newborn.   Cancer.  CAUSES   Monilia. This is a yeast or fungus infection, not an STD.   Bacterial vaginosis. The normal balance of bacteria in the vagina is disrupted and is replaced by an overgrowth of certain bacteria.   Gonorrhea, chlamydia. These are bacterial infections that are STDs.   Vaginal sponges, diaphragms, and intrauterine devices.   Trichomoniasis. This is a STD infection caused by a parasite.   Viruses like herpes and human papillomavirus. Both are STDs.   Pregnancy.   Immunosuppression. This occurs with certain conditions such as HIV infection or cancer.   Using bubble bath.   Taking certain antibiotic medicines.   Sporadic recurrence can occur if you become sick.   Diabetes.   Steroids.   Allergic reaction. If you have an allergy to:   Douches.   Soaps.   Spermicides.   Condoms.   Scented tampons or vaginal sprays.  SYMPTOMS   Abnormal vaginal discharge.   Itching of the vagina.   Pain in the vagina.   Swelling of the vagina.  In some cases, there are no symptoms. TREATMENT  Treatment will vary depending on the type of infection.  Bacteria or trichomonas are usually treated with oral antibiotics and sometimes vaginal cream or suppositories.   Monilia vaginitis is usually treated with vaginal creams, suppositories, or oral  antifungal pills.   Viral vaginitis has no cure. However, the symptoms of herpes (a viral vaginitis) can be treated to relieve the discomfort. Human papillomavirus has no symptoms. However, there are treatments for the diseases caused by human papillomavirus.   With allergic vaginitis, you need to stop using the product that is causing the problem. Vaginal creams can be used to treat the symptoms.   When treating an STD, the sex partner should also be treated.  HOME CARE INSTRUCTIONS   Take all the medicines as directed by your caregiver.   Do not use scented tampons, soaps, or vaginal sprays.   Do not douche.   Tell your sex partner if you have a vaginal infection or an STD.   Do not have sexual intercourse until you have treated the vaginitis.   Practice safe sex by using condoms.  SEEK MEDICAL CARE IF:   You have abdominal pain.   Your symptoms get worse during treatment.  Document Released: 07/06/2007 Document Revised: 08/28/2011 Document Reviewed: 03/01/2009 [facility] Patient Information 2012 [place], [place].

## 2012-01-22 NOTE — ED Provider Notes (Signed)
Medical screening examination/treatment/procedure(s) were performed by non-physician practitioner and as supervising physician I was immediately available for consultation/collaboration.   Gwyneth Sprout, MD 01/22/12 2258

## 2012-01-23 LAB — GC/CHLAMYDIA PROBE AMP, GENITAL
Chlamydia, DNA Probe: NEGATIVE
GC Probe Amp, Genital: POSITIVE — AB

## 2012-01-24 NOTE — ED Notes (Signed)
+   Gonorrhea. Patient treated with Rocephin and Zithromax. [facility] attached. 

## 2012-01-24 NOTE — ED Notes (Signed)
Patient notified of positive gonorrhea by phone after ID verified x two. STD instructions given, patient verbalized understanding. [facility] faxed.

## 2012-09-22 NOTE — L&D Delivery Note (Signed)
Delivery Note At 1:26 AM a viable female was delivered via Vaginal, Spontaneous Delivery (Presentation: ; Right Occiput Anterior), head birthed without difficulty, then anterior shoulder and remainder of body birthed spontaneously.   APGAR: pending at time of note; Infant placed directly on mom's abdomen for bonding/skin to skin. Cord allowed to stop pulsing, and was clamped x 2, and cut by intant's godmother.  Weight not available at time of note.   Placenta status: Intact, Spontaneous.  Cord:  with the following complications: None.    Anesthesia: Epidural  Episiotomy: None Lacerations: bilateral hemostatic first degree periurethral, no repair needed Suture Repair: n/a Est. Blood Loss (mL): 400  Mom to postpartum.  Baby to Couplet care / Skin to Skin. Plans to breast/bottlefeed, interested in nexplanon, OP circumcision  Marge Duncans 08/14/2013, 1:45 AM

## 2012-09-22 NOTE — L&D Delivery Note (Signed)
Attestation of Attending Supervision of Advanced Practitioner (PA/CNM/NP): Evaluation and management procedures were performed by the Advanced Practitioner under my supervision and collaboration.  I have reviewed the Advanced Practitioner's note and chart, and I agree with the management and plan.  Dillen Belmontes, MD, FACOG Attending Obstetrician & Gynecologist Faculty Practice, [facility]  

## 2012-12-08 ENCOUNTER — Encounter (HOSPITAL_COMMUNITY): Payer: Self-pay

## 2012-12-08 ENCOUNTER — Emergency Department (HOSPITAL_COMMUNITY)
Admission: EM | Admit: 2012-12-08 | Discharge: 2012-12-08 | Disposition: A | Discharge status: Home / Self Care | Payer: Medicaid Other | Attending: Emergency Medicine | Admitting: Emergency Medicine

## 2012-12-08 DIAGNOSIS — Z8619 Personal history of other infectious and parasitic diseases: Secondary | ICD-10-CM | POA: Insufficient documentation

## 2012-12-08 DIAGNOSIS — N898 Other specified noninflammatory disorders of vagina: Secondary | ICD-10-CM | POA: Insufficient documentation

## 2012-12-08 DIAGNOSIS — N72 Inflammatory disease of cervix uteri: Secondary | ICD-10-CM | POA: Insufficient documentation

## 2012-12-08 DIAGNOSIS — F17200 Nicotine dependence, unspecified, uncomplicated: Secondary | ICD-10-CM | POA: Insufficient documentation

## 2012-12-08 DIAGNOSIS — Z3202 Encounter for pregnancy test, result negative: Secondary | ICD-10-CM | POA: Insufficient documentation

## 2012-12-08 LAB — URINALYSIS, ROUTINE W REFLEX MICROSCOPIC
Glucose, UA: NEGATIVE mg/dL
Ketones, ur: NEGATIVE mg/dL
Leukocytes, UA: NEGATIVE
Nitrite: NEGATIVE
Protein, ur: NEGATIVE mg/dL
pH: 7 (ref 5.0–8.0)

## 2012-12-08 LAB — POCT PREGNANCY, URINE: Preg Test, Ur: NEGATIVE

## 2012-12-08 LAB — WET PREP, GENITAL

## 2012-12-08 MED ORDER — CEFTRIAXONE SODIUM 250 MG IJ SOLR
250.0000 mg | Freq: Once | INTRAMUSCULAR | Status: AC
  Administered 2012-12-08: 250 mg via INTRAMUSCULAR
  Filled 2012-12-08: qty 250

## 2012-12-08 MED ORDER — AZITHROMYCIN 250 MG PO TABS
1000.0000 mg | ORAL_TABLET | Freq: Once | ORAL | Status: AC
  Administered 2012-12-08: 1000 mg via ORAL
  Filled 2012-12-08: qty 4

## 2012-12-08 NOTE — ED Notes (Signed)
Pt is agitated and yelling very loud and threatening. Pt informed that she is waiting to be discharged and doctor is about to print out discharge paper. Security is called.

## 2012-12-08 NOTE — ED Provider Notes (Addendum)
History     CSN: [id number]  Arrival date & time 12/08/12  1001   First MD Initiated Contact with Patient 12/08/12 1113      Chief Complaint  Patient presents with  . SEXUALLY TRANSMITTED DISEASE    (Consider location/radiation/quality/duration/timing/severity/associated sxs/prior treatment) HPI.... white slimy vaginal discharge for one week. Patient is sexually active without protection. No fever, chills, flank pain, dysuria. Previous diagnosis of Chlamydia approximately one year ago. Severity is mild to moderate. Nothing makes symptoms better or worse.  History reviewed. No pertinent past medical history.  History reviewed. No pertinent past surgical history.  No family history on file.  History  Substance Use Topics  . Smoking status: Current Every Day Smoker -- 0.50 packs/day for 10 years    Types: Cigarettes  . Smokeless tobacco: Not on file  . Alcohol Use: Yes     Comment: occasional    OB History   Grav Para Term Preterm Abortions TAB SAB Ect Mult Living   2 2              Review of Systems  All other systems reviewed and are negative.    Allergies  Review of patient's allergies indicates no known allergies.  Home Medications  No current outpatient prescriptions on file.  BP 100/53  Pulse 65  Temp(Src) 98.6 F (37 C) (Oral)  Resp 20  SpO2 100%  LMP 10/24/2012  Physical Exam  Nursing note and vitals reviewed. Constitutional: She is oriented to person, place, and time. She appears well-developed and well-nourished.  HENT:  Head: Normocephalic and atraumatic.  Eyes: Conjunctivae and EOM are normal. Pupils are equal, round, and reactive to light.  Neck: Normal range of motion. Neck supple.  Cardiovascular: Normal rate, regular rhythm and normal heart sounds.   Pulmonary/Chest: Effort normal and breath sounds normal.  Abdominal: Soft. Bowel sounds are normal.  Genitourinary:  External genitalia normal. Small amount of white cervical discharge.  Slight cervical motion tenderness. Adnexa normal.  Musculoskeletal: Normal range of motion.  Neurological: She is alert and oriented to person, place, and time.  Skin: Skin is warm and dry.  Psychiatric: She has a normal mood and affect.    ED Course  Procedures (including critical care time)  Labs Reviewed  WET PREP, GENITAL - Abnormal; Notable for the following:    WBC, Wet Prep HPF POC FEW (*)    All other components within normal limits  URINALYSIS, ROUTINE W REFLEX MICROSCOPIC - Abnormal; Notable for the following:    Bilirubin Urine SMALL (*)    Urobilinogen, UA 4.0 (*)    All other components within normal limits  GC/CHLAMYDIA PROBE AMP  POCT PREGNANCY, URINE   No results found.   1. Cervicitis       MDM  No clinical evidence of PID. Rx Rocephin 250 IM and Zithromax 1 g by mouth. Cultures for gonorrhea, Chlamydia, wet prep.  No acute abdomen        Donnetta Hutching, MD 12/08/12 1409  Donnetta Hutching, MD 12/08/12 1409

## 2012-12-08 NOTE — ED Notes (Addendum)
Pt. Has requested to have a STD check.  Having vaginal discharge, thick white.   Also has requested to be checked for pregnancy and HIV.  Found out friend is cheating on her.  Having lower pelvic pain. Also having UTI symptoms, urgency.

## 2012-12-15 ENCOUNTER — Telehealth (HOSPITAL_COMMUNITY): Payer: Self-pay | Admitting: *Deleted

## 2012-12-17 ENCOUNTER — Emergency Department (HOSPITAL_COMMUNITY)
Admission: EM | Admit: 2012-12-17 | Discharge: 2012-12-17 | Disposition: A | Discharge status: Home / Self Care | Payer: Medicaid Other | Attending: Emergency Medicine | Admitting: Emergency Medicine

## 2012-12-17 ENCOUNTER — Encounter (HOSPITAL_COMMUNITY): Payer: Self-pay | Admitting: Emergency Medicine

## 2012-12-17 DIAGNOSIS — O99330 Smoking (tobacco) complicating pregnancy, unspecified trimester: Secondary | ICD-10-CM | POA: Insufficient documentation

## 2012-12-17 DIAGNOSIS — N898 Other specified noninflammatory disorders of vagina: Secondary | ICD-10-CM | POA: Insufficient documentation

## 2012-12-17 DIAGNOSIS — Z3490 Encounter for supervision of normal pregnancy, unspecified, unspecified trimester: Secondary | ICD-10-CM

## 2012-12-17 DIAGNOSIS — O9989 Other specified diseases and conditions complicating pregnancy, childbirth and the puerperium: Secondary | ICD-10-CM | POA: Insufficient documentation

## 2012-12-17 LAB — POCT PREGNANCY, URINE: Preg Test, Ur: POSITIVE — AB

## 2012-12-17 NOTE — ED Provider Notes (Signed)
History     CSN: [id number]  Arrival date & time 12/17/12  1029   First MD Initiated Contact with Patient 12/17/12 1119      Chief Complaint  Patient presents with  . Possible Pregnancy   HPI  27 y/o F presents to ED for concerns of being pregnanct. Her LMP was mid Feb/2014 she is not on birth control. She came 9 days ago with complaint of vaginal discharge. GC and CT were performed but the samples were reported as incorrect swab. She was treated with Rocephin and Zithromax and reports vaginal discharge is resolved. At that point Upreg was negative.  Denies abdominal/pelvic pain, nausea or vomiting.  History reviewed. No pertinent past medical history.  History reviewed. No pertinent past surgical history.  History reviewed. No pertinent family history.  History  Substance Use Topics  . Smoking status: Current Every Day Smoker -- 0.50 packs/day for 10 years    Types: Cigarettes  . Smokeless tobacco: Not on file  . Alcohol Use: Yes     Comment: occasional    OB History   Grav Para Term Preterm Abortions TAB SAB Ect Mult Living   3 2              Review of Systems  Constitutional: Negative.   HENT: Negative.   Respiratory: Negative.   Cardiovascular: Negative.   Gastrointestinal: Negative.   Genitourinary: Negative.   Musculoskeletal: Negative.   Skin: Negative.   Neurological: Negative.   Psychiatric/Behavioral: Negative.     Allergies  Review of patient's allergies indicates no known allergies.  Home Medications  No current outpatient prescriptions on file.  BP 115/59  Pulse 87  Temp(Src) 98.2 F (36.8 C) (Oral)  Resp 20  Ht 5\' 2"  (1.575 m)  Wt 128 lb (58.06 kg)  BMI 23.41 kg/m2  SpO2 98%  LMP 10/24/2012  Physical Exam  Constitutional: She appears well-developed and well-nourished. No distress.  HENT:  Mouth/Throat: No oropharyngeal exudate.  Eyes: Conjunctivae are normal. Pupils are equal, round, and reactive to light.  Neck: Normal range of  motion. Neck supple.  Cardiovascular: Normal rate, regular rhythm and normal heart sounds.   No murmur heard. Pulmonary/Chest: Effort normal and breath sounds normal.  Abdominal: Soft. Bowel sounds are normal. There is no tenderness.  Genitourinary: Vagina normal.  Vulva and perianal area: Normal  Speculum: Vagina and cervix of normal appearance, no friability, no discharge. Bimanual exam: Uterus anteverted, no adnexal masses. No cervical motion tenderness.     ED Course  Procedures (including critical care time)  Labs Reviewed  POCT PREGNANCY, URINE - Abnormal; Notable for the following:    Preg Test, Ur POSITIVE (*)    All other components within normal limits   No results found.   1. Pregnancy     MDM  GC and CT sample obtained Recommended to establish with PCP and f/u as outpatient.         Dayarmys Piloto de Criselda Peaches, MD 12/17/12 [phone]

## 2012-12-17 NOTE — ED Notes (Addendum)
Pt reports reports she took a pregnancy test this AM that was positive. Requesting to know how far along she is. Pt states LMP was sometime in Feb. Denies vaginal bleeding or pelvic pain at present. Pt reports also occasionally bleeding hemorrhoids.

## 2012-12-17 NOTE — ED Provider Notes (Signed)
I saw and evaluated the patient, reviewed the resident's note and I agree with the findings and plan.   .Face to face Exam:  General:  Awake HEENT:  Atraumatic Resp:  Normal effort Abd:  Nondistended Neuro:No focal weakness    Nelia Shi, MD 12/17/12 1209

## 2012-12-23 ENCOUNTER — Encounter (HOSPITAL_COMMUNITY): Payer: Self-pay

## 2012-12-23 ENCOUNTER — Inpatient Hospital Stay (HOSPITAL_COMMUNITY)
Admission: AD | Admit: 2012-12-23 | Discharge: 2012-12-23 | Disposition: A | Discharge status: Home / Self Care | Payer: Medicaid Other | Source: Ambulatory Visit | Attending: Obstetrics & Gynecology | Admitting: Obstetrics & Gynecology

## 2012-12-23 ENCOUNTER — Inpatient Hospital Stay (HOSPITAL_COMMUNITY): Payer: Medicaid Other

## 2012-12-23 DIAGNOSIS — O209 Hemorrhage in early pregnancy, unspecified: Secondary | ICD-10-CM | POA: Insufficient documentation

## 2012-12-23 DIAGNOSIS — Z3490 Encounter for supervision of normal pregnancy, unspecified, unspecified trimester: Secondary | ICD-10-CM

## 2012-12-23 DIAGNOSIS — O418X11 Other specified disorders of amniotic fluid and membranes, first trimester, fetus 1: Secondary | ICD-10-CM

## 2012-12-23 DIAGNOSIS — Z1389 Encounter for screening for other disorder: Secondary | ICD-10-CM

## 2012-12-23 DIAGNOSIS — R109 Unspecified abdominal pain: Secondary | ICD-10-CM | POA: Insufficient documentation

## 2012-12-23 HISTORY — DX: Chlamydial infection, unspecified: A74.9

## 2012-12-23 LAB — CBC
HCT: 31.1 % — ABNORMAL LOW (ref 36.0–46.0)
Hemoglobin: 10.6 g/dL — ABNORMAL LOW (ref 12.0–15.0)
MCH: 29.9 pg (ref 26.0–34.0)
MCHC: 34.1 g/dL (ref 30.0–36.0)
RDW: 13.8 % (ref 11.5–15.5)

## 2012-12-23 NOTE — MAU Provider Note (Signed)
History     CSN: [id number]  Arrival date and time: 12/23/12 4098   First Provider Initiated Contact with Patient 12/23/12 1058      Chief Complaint  Patient presents with  . Abdominal Pain   HPI This is a 27 y.o. female at about [redacted] weeks gestation who presents with c/o pelvic pressure but now pain Wants to confirm pregnancy. Denies bleeding.  RN Note: Patient states she has had a little lower abdominal pressure this morning, no pain at this time. States she has a little white vaginal discharge  Prior ED note from 3/28: 27 y/o F presents to ED for concerns of being pregnanct. Her LMP was mid Feb/2014 she is not on birth control. She came 9 days ago with complaint of vaginal discharge. GC and CT were performed but the samples were reported as incorrect swab. She was treated with Rocephin and Zithromax and reports vaginal discharge is resolved. At that point Upreg was negative. Denies abdominal/pelvic pain, nausea or vomiting. GC/Chlamydia Neg/Neg from 3/28  OB History   Grav Para Term Preterm Abortions TAB SAB Ect Mult Living   3 2 2              Past Medical History  Diagnosis Date  . Chlamydia     Past Surgical History  Procedure Laterality Date  . No past surgeries      Family History  Problem Relation Age of Onset  . Hypertension Maternal Grandfather     History  Substance Use Topics  . Smoking status: Current Every Day Smoker -- 0.50 packs/day for 10 years    Types: Cigarettes  . Smokeless tobacco: Not on file  . Alcohol Use: Yes     Comment: occasional    Allergies: No Known Allergies  No prescriptions prior to admission    Review of Systems  Constitutional: Negative for fever, chills and malaise/fatigue.  Gastrointestinal: Negative for heartburn, nausea, vomiting, abdominal pain (does have pressure), diarrhea and constipation.  Genitourinary: Negative for dysuria.  Musculoskeletal: Negative for myalgias.  Neurological: Negative for dizziness.   Psychiatric/Behavioral: Negative for depression.   Physical Exam   Blood pressure 108/49, pulse 75, temperature 98.4 F (36.9 C), temperature source Oral, resp. rate 16, height 5' 1.5" (1.562 m), weight 126 lb (57.153 kg), SpO2 100.00%.  Physical Exam  Constitutional: She is oriented to person, place, and time. She appears well-developed and well-nourished. No distress.  HENT:  Head: Normocephalic.  Cardiovascular: Normal rate.   Respiratory: Effort normal.  GI: Soft. She exhibits no distension and no mass. There is no tenderness. There is no rebound and no guarding.  Genitourinary: Vagina normal and uterus normal. No vaginal discharge found.  Musculoskeletal: Normal range of motion.  Neurological: She is alert and oriented to person, place, and time.  Skin: Skin is warm and dry.  Psychiatric: She has a normal mood and affect.    MAU Course  Procedures  MDM Results for orders placed during the hospital encounter of 12/23/12 (from the past 24 hour(s))  HCG, QUANTITATIVE, PREGNANCY     Status: Abnormal   Collection Time    12/23/12 10:34 AM      Result Value Range   hCG, Beta Chain, Quant, S 6589 (*) <5 mIU/mL  CBC     Status: Abnormal   Collection Time    12/23/12 10:34 AM      Result Value Range   WBC 6.2  4.0 - 10.5 K/uL   RBC 3.54 (*) 3.87 - 5.11  MIL/uL   Hemoglobin 10.6 (*) 12.0 - 15.0 g/dL   HCT 16.1 (*) 09.6 - 04.5 %   MCV 87.9  78.0 - 100.0 fL   MCH 29.9  26.0 - 34.0 pg   MCHC 34.1  30.0 - 36.0 g/dL   RDW 40.9  81.1 - 91.4 %   Platelets 231  150 - 400 K/uL   [place]:  [redacted]w[redacted]d embryo with HR 103, subchorionic hemorrhage seen  Assessment and Plan  A:  SIUP at 5.5 weeks      [facility]       P:  Discharge        Reviewed results of tests       Encouraged to seen [facility]      Proof of pregnancy and referral list given  [facility] 12/23/2012, 11:55 AM

## 2012-12-23 NOTE — MAU Note (Signed)
Patient states she has had a little lower abdominal pressure this morning, no pain at this time. States she has a little white vaginal discharge.

## 2012-12-27 NOTE — MAU Provider Note (Signed)
Attestation of Attending Supervision of Advanced Practitioner (CNM/NP): Evaluation and management procedures were performed by the Advanced Practitioner under my supervision and collaboration.  I have reviewed the Advanced Practitioner's note and chart, and I agree with the management and plan.  HARRAWAY-SMITH, Bonniejean Piano 3:37 PM      

## 2013-01-13 ENCOUNTER — Encounter (HOSPITAL_COMMUNITY): Payer: Self-pay | Admitting: *Deleted

## 2013-01-13 ENCOUNTER — Inpatient Hospital Stay (HOSPITAL_COMMUNITY)
Admission: AD | Admit: 2013-01-13 | Discharge: 2013-01-13 | Disposition: A | Discharge status: Home / Self Care | Payer: Self-pay | Source: Ambulatory Visit | Attending: Obstetrics & Gynecology | Admitting: Obstetrics & Gynecology

## 2013-01-13 DIAGNOSIS — O9989 Other specified diseases and conditions complicating pregnancy, childbirth and the puerperium: Secondary | ICD-10-CM

## 2013-01-13 DIAGNOSIS — O26899 Other specified pregnancy related conditions, unspecified trimester: Secondary | ICD-10-CM

## 2013-01-13 DIAGNOSIS — O99891 Other specified diseases and conditions complicating pregnancy: Secondary | ICD-10-CM | POA: Insufficient documentation

## 2013-01-13 DIAGNOSIS — N949 Unspecified condition associated with female genital organs and menstrual cycle: Secondary | ICD-10-CM | POA: Insufficient documentation

## 2013-01-13 LAB — URINALYSIS, ROUTINE W REFLEX MICROSCOPIC
Bilirubin Urine: NEGATIVE
Glucose, UA: NEGATIVE mg/dL
Hgb urine dipstick: NEGATIVE
Ketones, ur: NEGATIVE mg/dL
Protein, ur: NEGATIVE mg/dL

## 2013-01-13 NOTE — MAU Note (Signed)
Pt presents with complaints of pelvic pain last night around 10pm. Denies any bleeding. States that she is approximately [redacted]weeks pregnant.

## 2013-01-13 NOTE — MAU Provider Note (Signed)
Chief Complaint: Pelvic Pain   First Provider Initiated Contact with Patient 01/13/13 0915     SUBJECTIVE HPI: Joanna Melton is a 27 y.o. G3P2001 at 9wks by early [place] who presents with bilateral groin pain since yesterday when she was running after her nephew. The pain is crampy and occurring with movement. She is concerned that she hasn't gotten care and would like to hear the fetal heart. No bleeding. No UTI symptoms has appointment [facility].  Past Medical History  Diagnosis Date  . Chlamydia    OB History   Grav Para Term Preterm Abortions TAB SAB Ect Mult Living   3 2 2       1      # Outc Date GA Lbr Len/2nd Wgt Sex Del Anes PTL Lv   1 TRM 8/07    F SVD EPI     2 TRM 8/08    M SVD EPI  Yes   3 CUR              Past Surgical History  Procedure Laterality Date  . No past surgeries     History   Social History  . Marital Status: Single    Spouse Name: N/A    Number of Children: N/A  . Years of Education: N/A   Occupational History  . Not on file.   Social History Main Topics  . Smoking status: Current Every Day Smoker -- 0.50 packs/day for 10 years    Types: Cigarettes  . Smokeless tobacco: Not on file  . Alcohol Use: Yes     Comment: occasional  . Drug Use: No  . Sexually Active: Yes    Birth Control/ Protection: None, Condom   Other Topics Concern  . Not on file   Social History Narrative  . No narrative on file   No current facility-administered medications on file prior to encounter.   No current outpatient prescriptions on file prior to encounter.   No Known Allergies  ROS: Pertinent items in HPI  OBJECTIVE Blood pressure 115/52, pulse 69, temperature 98.3 F (36.8 C), temperature source Oral, resp. rate 18, height 5\' 2"  (1.575 m), weight 131 lb (59.421 kg). GENERAL: Well-developed, well-nourished female in no acute distress.  HEENT: Normocephalic HEART: normal rate RESP: normal effort ABDOMEN: Soft,  non-tender EXTREMITIES: Nontender, no edema NEURO: Alert and oriented Cx L/C  LAB RESULTS Results for orders placed during the hospital encounter of 01/13/13 (from the past 24 hour(s))  URINALYSIS, ROUTINE W REFLEX MICROSCOPIC     Status: None   Collection Time    01/13/13  8:50 AM      Result Value Range   Color, Urine YELLOW  YELLOW   APPearance CLEAR  CLEAR   Specific Gravity, Urine 1.020  1.005 - 1.030   pH 8.0  5.0 - 8.0   Glucose, UA NEGATIVE  NEGATIVE mg/dL   Hgb urine dipstick NEGATIVE  NEGATIVE   Bilirubin Urine NEGATIVE  NEGATIVE   Ketones, ur NEGATIVE  NEGATIVE mg/dL   Protein, ur NEGATIVE  NEGATIVE mg/dL   Urobilinogen, UA 0.2  0.0 - 1.0 mg/dL   Nitrite NEGATIVE  NEGATIVE   Leukocytes, UA NEGATIVE  NEGATIVE    IMAGING Bedside US> family viewed normal fetal cardiac activity and FM     ASSESSMENT 1. Pain of round ligament complicating pregnancy, antepartum   Viable IUP 9wks  PLAN Discharge home with AVS round ligament pain Follow-up Information   Follow  up with [facility] [place]. (keep your scheduled appointment)    Contact information:   [address] [place] [address] [phone]         Danae Orleans, CNM 01/13/2013  9:26 AM

## 2013-03-02 ENCOUNTER — Encounter (HOSPITAL_COMMUNITY): Payer: Self-pay

## 2013-03-02 ENCOUNTER — Inpatient Hospital Stay (HOSPITAL_COMMUNITY)
Admission: AD | Admit: 2013-03-02 | Discharge: 2013-03-02 | Disposition: A | Discharge status: Home / Self Care | Payer: Self-pay | Source: Ambulatory Visit | Attending: Obstetrics & Gynecology | Admitting: Obstetrics & Gynecology

## 2013-03-02 DIAGNOSIS — B9689 Other specified bacterial agents as the cause of diseases classified elsewhere: Secondary | ICD-10-CM | POA: Insufficient documentation

## 2013-03-02 DIAGNOSIS — O2390 Unspecified genitourinary tract infection in pregnancy, unspecified trimester: Secondary | ICD-10-CM | POA: Insufficient documentation

## 2013-03-02 DIAGNOSIS — R109 Unspecified abdominal pain: Secondary | ICD-10-CM | POA: Insufficient documentation

## 2013-03-02 DIAGNOSIS — N949 Unspecified condition associated with female genital organs and menstrual cycle: Secondary | ICD-10-CM

## 2013-03-02 DIAGNOSIS — A499 Bacterial infection, unspecified: Secondary | ICD-10-CM | POA: Insufficient documentation

## 2013-03-02 DIAGNOSIS — N760 Acute vaginitis: Secondary | ICD-10-CM | POA: Insufficient documentation

## 2013-03-02 LAB — URINALYSIS, ROUTINE W REFLEX MICROSCOPIC
Bilirubin Urine: NEGATIVE
Glucose, UA: NEGATIVE mg/dL
Hgb urine dipstick: NEGATIVE
Ketones, ur: NEGATIVE mg/dL
Leukocytes, UA: NEGATIVE
pH: 8.5 — ABNORMAL HIGH (ref 5.0–8.0)

## 2013-03-02 LAB — WET PREP, GENITAL: Trich, Wet Prep: NONE SEEN

## 2013-03-02 MED ORDER — ABDOMINAL BINDER/ELASTIC MED MISC: 1.0000 [IU] | Freq: Every day | Status: DC | PRN

## 2013-03-02 MED ORDER — METRONIDAZOLE 500 MG PO TABS: 500.0000 mg | ORAL_TABLET | Freq: Two times a day (BID) | ORAL | Status: DC

## 2013-03-02 MED ORDER — CYCLOBENZAPRINE HCL 10 MG PO TABS: 10.0000 mg | ORAL_TABLET | Freq: Two times a day (BID) | ORAL | Status: DC | PRN
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
## 2013-03-02 NOTE — MAU Note (Signed)
Patient is in with c/o lower abdominal cramping that started about 3 days ago. She also wants STD screen and an ultrasound to find out the sex of her fetus. She have not started prenatal care because of pending insurance. She denies any vaginal bleeding, n/v.

## 2013-03-02 NOTE — MAU Provider Note (Signed)
History     CSN: [id number]  Arrival date and time: 03/02/13 8295   First Provider Initiated Contact with Patient 03/02/13 [phone]      Chief Complaint  Patient presents with  . Abdominal Pain   HPI [name] is a 27 y.o. G3P2001 at [redacted]w[redacted]d who presents to MAU today with complaint of lower abdominal pain. The patient states that the pain is bilateral and occurs with ambulation and change or positions. She denies vaginal bleeding, N/V/D or constipation or fever. She has a slight mucus discharge. She plans to return to [facility] for prenatal care with Medicaid is approved.   OB History   Grav Para Term Preterm Abortions TAB SAB Ect Mult Living   3 2 2       1       Past Medical History  Diagnosis Date  . Chlamydia     Past Surgical History  Procedure Laterality Date  . No past surgeries      Family History  Problem Relation Age of Onset  . Hypertension Maternal Grandfather     History  Substance Use Topics  . Smoking status: Current Every Day Smoker -- 0.50 packs/day for 10 years    Types: Cigarettes  . Smokeless tobacco: Not on file  . Alcohol Use: Yes     Comment: occasional    Allergies: No Known Allergies  No prescriptions prior to admission    Review of Systems  Constitutional: Negative for fever and malaise/fatigue.  Gastrointestinal: Positive for abdominal pain. Negative for nausea, vomiting, diarrhea and constipation.  Genitourinary: Negative for dysuria, urgency and frequency.       Neg - vaginal bleeding + vaginal discharge   Physical Exam   Blood pressure 112/55, pulse 71, temperature 98.5 F (36.9 C), resp. rate 16, height 5\' 2"  (1.575 m), weight 140 lb 6 oz (63.674 kg), SpO2 100.00%.  Physical Exam  Constitutional: She is oriented to person, place, and time. She appears well-developed and well-nourished. No distress.  HENT:  Head: Normocephalic and atraumatic.  Cardiovascular: Normal rate.   Respiratory: Effort normal.  GI: Soft. She  exhibits no distension and no mass. There is no tenderness. There is no rebound and no guarding.  Genitourinary: Uterus is enlarged (appropriate for GA). Uterus is not tender. Cervix exhibits discharge (scant mucus discharge). Cervix exhibits no motion tenderness and no friability. No bleeding around the vagina. Vaginal discharge (scant mucus discharge noted) found.  Neurological: She is alert and oriented to person, place, and time.  Skin: Skin is warm and dry. No erythema.  Psychiatric: She has a normal mood and affect.   Results for orders placed during the hospital encounter of 03/02/13 (from the past 24 hour(s))  URINALYSIS, ROUTINE W REFLEX MICROSCOPIC     Status: Abnormal   Collection Time    03/02/13  9:05 AM      Result Value Range   Color, Urine YELLOW  YELLOW   APPearance CLEAR  CLEAR   Specific Gravity, Urine 1.020  1.005 - 1.030   pH 8.5 (*) 5.0 - 8.0   Glucose, UA NEGATIVE  NEGATIVE mg/dL   Hgb urine dipstick NEGATIVE  NEGATIVE   Bilirubin Urine NEGATIVE  NEGATIVE   Ketones, ur NEGATIVE  NEGATIVE mg/dL   Protein, ur NEGATIVE  NEGATIVE mg/dL   Urobilinogen, UA 0.2  0.0 - 1.0 mg/dL   Nitrite NEGATIVE  NEGATIVE   Leukocytes, UA NEGATIVE  NEGATIVE  POCT PREGNANCY, URINE     Status: Abnormal  Collection Time    03/02/13  9:10 AM      Result Value Range   Preg Test, Ur POSITIVE (*) NEGATIVE  WET PREP, GENITAL     Status: Abnormal   Collection Time    03/02/13  9:50 AM      Result Value Range   Yeast Wet Prep HPF POC NONE SEEN  NONE SEEN   Trich, Wet Prep NONE SEEN  NONE SEEN   Clue Cells Wet Prep HPF POC FEW (*) NONE SEEN   WBC, Wet Prep HPF POC MODERATE (*) NONE SEEN    MAU Course  Procedures None  MDM UA, UPT, Wet prep and GC/Chlamydia today +FHTs  Assessment and Plan  A: Round ligament pain Bacterial vaginosis  P: Discharge home Rx for Flexeril and Flagyl sent to patient's pharmacy GC/Chlamydia pending. Patient will be contacted with any abnormal  results Recommended Tylenol PRN, warm baths and abdominal binder for relief of round ligament pain Patient encouraged to start care with [facility] when Medicaid is approved Patient may return to MAU as needed  Freddi Starr, PA-C  03/02/2013, 11:50 AM

## 2013-03-02 NOTE — MAU Provider Note (Signed)
Attestation of Attending Supervision of Advanced Practitioner (CNM/NP): Evaluation and management procedures were performed by the Advanced Practitioner under my supervision and collaboration.  I have reviewed the Advanced Practitioner's note and chart, and I agree with the management and plan.  HARRAWAY-SMITH, Ryin Schillo 1:10 PM

## 2013-03-03 LAB — GC/CHLAMYDIA PROBE AMP: GC Probe RNA: NEGATIVE

## 2013-04-01 ENCOUNTER — Encounter (HOSPITAL_COMMUNITY): Payer: Self-pay

## 2013-04-01 ENCOUNTER — Inpatient Hospital Stay (HOSPITAL_COMMUNITY)
Admission: AD | Admit: 2013-04-01 | Discharge: 2013-04-01 | Disposition: A | Discharge status: Home / Self Care | Payer: Medicaid Other | Source: Ambulatory Visit | Attending: Obstetrics & Gynecology | Admitting: Obstetrics & Gynecology

## 2013-04-01 DIAGNOSIS — N949 Unspecified condition associated with female genital organs and menstrual cycle: Secondary | ICD-10-CM

## 2013-04-01 DIAGNOSIS — R109 Unspecified abdominal pain: Secondary | ICD-10-CM | POA: Insufficient documentation

## 2013-04-01 DIAGNOSIS — O26899 Other specified pregnancy related conditions, unspecified trimester: Secondary | ICD-10-CM

## 2013-04-01 DIAGNOSIS — O9989 Other specified diseases and conditions complicating pregnancy, childbirth and the puerperium: Secondary | ICD-10-CM

## 2013-04-01 DIAGNOSIS — O99891 Other specified diseases and conditions complicating pregnancy: Secondary | ICD-10-CM | POA: Insufficient documentation

## 2013-04-01 LAB — URINALYSIS, ROUTINE W REFLEX MICROSCOPIC
Bilirubin Urine: NEGATIVE
Ketones, ur: NEGATIVE mg/dL
Nitrite: NEGATIVE
pH: 8 (ref 5.0–8.0)

## 2013-04-01 NOTE — MAU Note (Signed)
Been having pain in lower stomach.  Was given an rx for flexaril, did not pick it up- no medicaid yet.  Still having the pains.  (asked if she would be having an [place] and be able to see what the baby is today)

## 2013-04-01 NOTE — MAU Note (Signed)
States pain is lessened from last visit however is still noted intermittently, esp with walking. Unable to afford medication that was prescribed. Would like u/s as well.

## 2013-04-01 NOTE — MAU Provider Note (Signed)
History     CSN: [id number]  Arrival date and time: 04/01/13 0859   None     Chief Complaint  Patient presents with  . Abdominal Pain   HPI 27 y.o. G3P2001 at [facility] with ongoing round ligament pain. Seen last month with same c/o - asked for and u/s to find out gender at that time, did not have ultrasound. Was treated for BV with Flagyl - she did get that rx filled and took as prescribed. She was also prescribed flexeril and a maternity support brace, which she did not get yet. She states she is having the same discomfort as she was having at her last visit, but it is not as bad now. No bleeding or discharge. Awaiting medicaid to start prenatal care. Asking for an ultrasound to find out gender again today.   Past Medical History  Diagnosis Date  . Chlamydia     Past Surgical History  Procedure Laterality Date  . No past surgeries      Family History  Problem Relation Age of Onset  . Hypertension Maternal Grandfather     History  Substance Use Topics  . Smoking status: Current Every Day Smoker -- 0.50 packs/day for 10 years    Types: Cigarettes  . Smokeless tobacco: Never Used  . Alcohol Use: Yes     Comment: occasional    Allergies: No Known Allergies  Prescriptions prior to admission  Medication Sig Dispense Refill  . metroNIDAZOLE (FLAGYL) 500 MG tablet Take 1 tablet (500 mg total) by mouth 2 (two) times daily.  14 tablet  0  . Prenatal Vit-Fe Fumarate-FA (PRENATAL MULTIVITAMIN) TABS Take 1 tablet by mouth daily at 12 noon.      . cyclobenzaprine (FLEXERIL) 10 MG tablet Take 1 tablet (10 mg total) by mouth 2 (two) times daily as needed for muscle spasms.  20 tablet  0  . Elastic Bandages & Supports (ABDOMINAL BINDER/ELASTIC MED) MISC 1 Units by Does not apply route daily as needed.  1 each  0    Review of Systems  Constitutional: Negative.   Respiratory: Negative.   Cardiovascular: Negative.   Gastrointestinal: Negative for nausea, vomiting, abdominal pain,  diarrhea and constipation.  Genitourinary: Negative for dysuria, urgency, frequency, hematuria and flank pain.       Negative for vaginal bleeding, cramping/contractions  Musculoskeletal: Negative.   Neurological: Negative.   Psychiatric/Behavioral: Negative.    Physical Exam   Blood pressure 109/53, pulse 68, resp. rate 16.  Physical Exam  Nursing note and vitals reviewed. Constitutional: She is oriented to person, place, and time. She appears well-developed and well-nourished. No distress.  Cardiovascular: Normal rate.   Respiratory: Effort normal.  Musculoskeletal: Normal range of motion.  Neurological: She is alert and oriented to person, place, and time.  Skin: Skin is warm and dry.  Psychiatric: She has a normal mood and affect.    MAU Course  Procedures  Results for orders placed during the hospital encounter of 04/01/13 (from the past 24 hour(s))  URINALYSIS, ROUTINE W REFLEX MICROSCOPIC     Status: None   Collection Time    04/01/13  9:20 AM      Result Value Range   Color, Urine YELLOW  YELLOW   APPearance CLEAR  CLEAR   Specific Gravity, Urine 1.015  1.005 - 1.030   pH 8.0  5.0 - 8.0   Glucose, UA NEGATIVE  NEGATIVE mg/dL   Hgb urine dipstick NEGATIVE  NEGATIVE   Bilirubin Urine NEGATIVE  NEGATIVE   Ketones, ur NEGATIVE  NEGATIVE mg/dL   Protein, ur NEGATIVE  NEGATIVE mg/dL   Urobilinogen, UA 0.2  0.0 - 1.0 mg/dL   Nitrite NEGATIVE  NEGATIVE   Leukocytes, UA NEGATIVE  NEGATIVE     Assessment and Plan   1. Pain of round ligament complicating pregnancy, antepartum   Pt will start [facility] - message sent to schedule lab appointment and NOB appoointment Anatomy u/s scheduled for next week Precautions rev'd, f/u PRN    Medication List         Abdominal Binder/Elastic Med Misc  1 Units by Does not apply route daily as needed.     cyclobenzaprine 10 MG tablet  Commonly known as:  FLEXERIL  Take 1 tablet (10 mg total) by mouth 2 (two) times daily as  needed for muscle spasms.     metroNIDAZOLE 500 MG tablet  Commonly known as:  FLAGYL  Take 1 tablet (500 mg total) by mouth 2 (two) times daily.     prenatal multivitamin Tabs  Take 1 tablet by mouth daily at 12 noon.            Follow-up Information   Follow up with [facility] On 04/05/2013. (arrive at 1:30 PM)    Contact information:   [address] [id number] [place] [address] [phone]      Follow up with [facility] On 04/01/2013. (someone will call to schedule)         Evertt Chouinard 04/01/2013, 9:53 AM

## 2013-04-01 NOTE — MAU Provider Note (Signed)
Attestation of Attending Supervision of Advanced Practitioner (PA/CNM/NP): Evaluation and management procedures were performed by the Advanced Practitioner under my supervision and collaboration.  I have reviewed the Advanced Practitioner's note and chart, and I agree with the management and plan.  Damoni Erker, MD, FACOG Attending Obstetrician & Gynecologist Faculty Practice, [facility]  

## 2013-04-05 ENCOUNTER — Ambulatory Visit (HOSPITAL_COMMUNITY)
Admit: 2013-04-05 | Discharge: 2013-04-05 | Disposition: A | Discharge status: Home / Self Care | Payer: Medicaid Other | Attending: Obstetrics & Gynecology | Admitting: Obstetrics & Gynecology

## 2013-04-05 DIAGNOSIS — O26899 Other specified pregnancy related conditions, unspecified trimester: Secondary | ICD-10-CM

## 2013-05-03 ENCOUNTER — Ambulatory Visit (INDEPENDENT_AMBULATORY_CARE_PROVIDER_SITE_OTHER): Payer: Self-pay | Admitting: Obstetrics and Gynecology

## 2013-05-03 VITALS — BP 113/61 | Temp 97.1°F | Wt 149.6 lb

## 2013-05-03 DIAGNOSIS — Z3482 Encounter for supervision of other normal pregnancy, second trimester: Secondary | ICD-10-CM

## 2013-05-03 DIAGNOSIS — O99332 Smoking (tobacco) complicating pregnancy, second trimester: Secondary | ICD-10-CM

## 2013-05-03 DIAGNOSIS — O0932 Supervision of pregnancy with insufficient antenatal care, second trimester: Secondary | ICD-10-CM

## 2013-05-03 DIAGNOSIS — Z3480 Encounter for supervision of other normal pregnancy, unspecified trimester: Secondary | ICD-10-CM | POA: Insufficient documentation

## 2013-05-03 DIAGNOSIS — F17200 Nicotine dependence, unspecified, uncomplicated: Secondary | ICD-10-CM

## 2013-05-03 DIAGNOSIS — O0930 Supervision of pregnancy with insufficient antenatal care, unspecified trimester: Secondary | ICD-10-CM

## 2013-05-03 DIAGNOSIS — O99330 Smoking (tobacco) complicating pregnancy, unspecified trimester: Secondary | ICD-10-CM

## 2013-05-03 LAB — POCT URINALYSIS DIP (DEVICE)
Ketones, ur: NEGATIVE mg/dL
Protein, ur: NEGATIVE mg/dL
Specific Gravity, Urine: 1.025 (ref 1.005–1.030)
pH: 7 (ref 5.0–8.0)

## 2013-05-03 NOTE — Progress Notes (Signed)
   Subjective:    Joanna [facility] is a [id number] [redacted]w[redacted]d being seen today for her first obstetrical visit.  Her obstetrical history is significant for smoker and late to care. Patient does intend to breast feed. Pregnancy history fully reviewed.  Patient reports occasional pelvic pressure.  Filed Vitals:   05/03/13 1420  BP: 113/61  Temp: 97.1 F (36.2 C)  Weight: 149 lb 9.6 oz (67.858 kg)    HISTORY: OB History   Grav Para Term Preterm Abortions TAB SAB Ect Mult Living   3 2 2  0 0 0 0 0 0 2     # Outc Date GA Lbr Len/2nd Wgt Sex Del Anes PTL Lv   1 TRM 8/07 [redacted]w[redacted]d  5lb9oz(2.523kg) F SVD EPI  Yes   2 TRM 8/08 [redacted]w[redacted]d  5lb14oz(2.665kg) M SVD EPI  Yes   3 CUR              Past Medical History  Diagnosis Date  . Chlamydia    Past Surgical History  Procedure Laterality Date  . No past surgeries     Family History  Problem Relation Age of Onset  . Hypertension Maternal Grandfather   . Diabetes Maternal Grandfather   . Hypertension Mother      Exam    Uterus:     Pelvic Exam:    Perineum: Normal Perineum   Vulva: normal   Vagina:  normal mucosa, normal discharge   pH:    Cervix: closed and long   Adnexa: no mass, fullness, tenderness   Bony Pelvis: android  System: Breast:  normal appearance, no masses or tenderness   Skin: normal coloration and turgor, no rashes    Neurologic: oriented, no focal deficits   Extremities: normal strength, tone, and muscle mass   HEENT extra ocular movement intact   Mouth/Teeth mucous membranes moist, pharynx normal without lesions   Neck supple and no masses   Cardiovascular: regular rate and rhythm   Respiratory:  chest clear, no wheezing, crepitations, rhonchi, normal symmetric air entry   Abdomen: soft, gravid, NT   Urinary:       Assessment:    Pregnancy: [id number] Patient Active Problem List   Diagnosis Date Noted  . Supervision of other normal pregnancy 05/03/2013  . Insufficient prenatal care in second trimester  05/03/2013  . Smoker 05/03/2013  . Tobacco smoking complicating pregnancy 05/03/2013        Plan:     Initial labs drawn. Prenatal vitamins. Problem list reviewed and updated. Genetic Screening discussed : too late.  Ultrasound discussed; fetal survey: results reviewed. Patient admits to smoking 2-3 cigarettes/day. Patient advised to quit smoking which should not be too challenging as she is barely smoking  Follow up in 4 weeks. 50% of 30 min visit spent on counseling and coordination of care.     Valiant Dills 05/03/2013

## 2013-05-03 NOTE — Progress Notes (Signed)
Pulse- 74 Patient reports pelvic pressure and pain when she is laying down and gets up

## 2013-05-04 LAB — OBSTETRIC PANEL
Antibody Screen: NEGATIVE
Basophils Absolute: 0 10*3/uL (ref 0.0–0.1)
Eosinophils Relative: 1 % (ref 0–5)
HCT: 26.8 % — ABNORMAL LOW (ref 36.0–46.0)
Lymphocytes Relative: 24 % (ref 12–46)
MCHC: 34 g/dL (ref 30.0–36.0)
MCV: 89.9 fL (ref 78.0–100.0)
Monocytes Absolute: 0.6 10*3/uL (ref 0.1–1.0)
RDW: 14 % (ref 11.5–15.5)
Rh Type: POSITIVE
Rubella: 1.52 Index — ABNORMAL HIGH (ref <0.90)
WBC: 7.5 10*3/uL (ref 4.0–10.5)

## 2013-05-04 LAB — HIV ANTIBODY (ROUTINE TESTING W REFLEX): HIV: NONREACTIVE

## 2013-05-05 LAB — CULTURE, OB URINE

## 2013-05-05 LAB — HEMOGLOBINOPATHY EVALUATION: Hgb F Quant: 0 % (ref 0.0–2.0)

## 2013-05-06 ENCOUNTER — Other Ambulatory Visit: Payer: Self-pay | Admitting: Obstetrics and Gynecology

## 2013-05-06 ENCOUNTER — Encounter: Payer: Self-pay | Admitting: Obstetrics and Gynecology

## 2013-05-06 DIAGNOSIS — O2340 Unspecified infection of urinary tract in pregnancy, unspecified trimester: Secondary | ICD-10-CM | POA: Insufficient documentation

## 2013-05-06 DIAGNOSIS — B951 Streptococcus, group B, as the cause of diseases classified elsewhere: Secondary | ICD-10-CM | POA: Insufficient documentation

## 2013-05-06 MED ORDER — PENICILLIN V POTASSIUM 500 MG PO TABS: 500.0000 mg | ORAL_TABLET | Freq: Four times a day (QID) | ORAL | Status: DC

## 2013-05-10 ENCOUNTER — Telehealth: Payer: Self-pay | Admitting: General Practice

## 2013-05-10 NOTE — Telephone Encounter (Signed)
Message copied by Kathee [facility] on Tue May 10, 2013  8:49 AM ------      Message from: Joanna [facility]      Created: Fri May 06, 2013  8:35 AM       Please inform patient of positive UTI. Rx e-prescribed            Peggy ------

## 2013-05-10 NOTE — Telephone Encounter (Signed)
Called patient and informed her of UTI and antibiotic available for pickup. Patient verbalized understanding and had no further questions  

## 2013-05-31 ENCOUNTER — Ambulatory Visit (INDEPENDENT_AMBULATORY_CARE_PROVIDER_SITE_OTHER): Payer: Medicaid Other | Admitting: Obstetrics & Gynecology

## 2013-05-31 ENCOUNTER — Encounter: Payer: Self-pay | Admitting: Obstetrics & Gynecology

## 2013-05-31 VITALS — BP 106/59 | Temp 97.1°F | Wt 152.3 lb

## 2013-05-31 DIAGNOSIS — N760 Acute vaginitis: Secondary | ICD-10-CM

## 2013-05-31 DIAGNOSIS — O2390 Unspecified genitourinary tract infection in pregnancy, unspecified trimester: Secondary | ICD-10-CM

## 2013-05-31 DIAGNOSIS — F1920 Other psychoactive substance dependence, uncomplicated: Secondary | ICD-10-CM

## 2013-05-31 DIAGNOSIS — O99323 Drug use complicating pregnancy, third trimester: Secondary | ICD-10-CM | POA: Insufficient documentation

## 2013-05-31 DIAGNOSIS — B951 Streptococcus, group B, as the cause of diseases classified elsewhere: Secondary | ICD-10-CM

## 2013-05-31 LAB — CBC
HCT: 26.4 % — ABNORMAL LOW (ref 36.0–46.0)
Hemoglobin: 9 g/dL — ABNORMAL LOW (ref 12.0–15.0)
WBC: 7.2 10*3/uL (ref 4.0–10.5)

## 2013-05-31 LAB — POCT URINALYSIS DIP (DEVICE)
Bilirubin Urine: NEGATIVE
Glucose, UA: NEGATIVE mg/dL
Ketones, ur: NEGATIVE mg/dL
Leukocytes, UA: NEGATIVE
pH: 7 (ref 5.0–8.0)

## 2013-05-31 MED ORDER — FLUCONAZOLE 150 MG PO TABS: 150.0000 mg | ORAL_TABLET | Freq: Once | ORAL | Status: DC

## 2013-05-31 NOTE — Progress Notes (Signed)
Pulse: 79

## 2013-05-31 NOTE — Progress Notes (Signed)
Pt reports to smoking THC during pregnancy to help her sleep.  Has also been smoking tob.   Counseled about drug use. 1hr GTT, RPR and HIV today Urine tox ordered

## 2013-05-31 NOTE — Addendum Note (Signed)
Addended by: Franchot Mimes on: 05/31/2013 11:51 AM   Modules accepted: Orders

## 2013-05-31 NOTE — Patient Instructions (Signed)

## 2013-06-01 ENCOUNTER — Encounter: Payer: Self-pay | Admitting: Obstetrics & Gynecology

## 2013-06-01 ENCOUNTER — Telehealth: Payer: Self-pay | Admitting: General Practice

## 2013-06-01 DIAGNOSIS — O99013 Anemia complicating pregnancy, third trimester: Secondary | ICD-10-CM | POA: Insufficient documentation

## 2013-06-01 LAB — URINE DRUGS OF ABUSE SCREEN W ALC, ROUTINE (REF LAB)
Amphetamine Screen, Ur: NEGATIVE
Benzodiazepines.: NEGATIVE
Cocaine Metabolites: NEGATIVE
Creatinine,U: 68.1 mg/dL
Ethyl Alcohol: <10 mg/dL (ref <10)
Phencyclidine (PCP): NEGATIVE

## 2013-06-01 LAB — RPR

## 2013-06-01 LAB — GLUCOSE TOLERANCE, 1 HOUR (50G) W/O FASTING: Glucose, 1 Hour GTT: 84 mg/dL (ref 70–140)

## 2013-06-01 NOTE — Telephone Encounter (Signed)
Called patient and informed her of results & recommendations. Patient verbalized understanding and had no further questions  

## 2013-06-01 NOTE — Telephone Encounter (Signed)
Message copied by Kathee [facility] on Wed Jun 01, 2013  4:29 PM ------      Message from: Willodean Rosenthal      Created: Wed Jun 01, 2013  4:06 PM       Please call pt.  She needs to begin FeSO4 for anemia.            Thx,      clh-S ------

## 2013-06-02 LAB — CANNABANOIDS (GC/LC/MS), URINE: THC-COOH (GC/LC/MS), ur confirm: 278 ng/mL

## 2013-06-14 ENCOUNTER — Encounter: Payer: Medicaid Other | Admitting: Obstetrics & Gynecology

## 2013-06-21 ENCOUNTER — Encounter: Payer: Medicaid Other | Admitting: Family Medicine

## 2013-06-29 ENCOUNTER — Ambulatory Visit (INDEPENDENT_AMBULATORY_CARE_PROVIDER_SITE_OTHER): Payer: Medicaid Other | Admitting: Family Medicine

## 2013-06-29 ENCOUNTER — Encounter: Payer: Self-pay | Admitting: Family Medicine

## 2013-06-29 VITALS — BP 103/63 | Wt 157.3 lb

## 2013-06-29 DIAGNOSIS — Z3483 Encounter for supervision of other normal pregnancy, third trimester: Secondary | ICD-10-CM

## 2013-06-29 DIAGNOSIS — B951 Streptococcus, group B, as the cause of diseases classified elsewhere: Secondary | ICD-10-CM

## 2013-06-29 DIAGNOSIS — O2390 Unspecified genitourinary tract infection in pregnancy, unspecified trimester: Secondary | ICD-10-CM

## 2013-06-29 NOTE — Progress Notes (Signed)
Pulse: 89 Would like std screening.  Would like to do flu shot at next visit.  Patient would like to have her cervix checked. States that she is having contractions and pelvic pressure.

## 2013-06-29 NOTE — Progress Notes (Signed)
Pt is a 27 yo G3P2002 @ [redacted]w[redacted]d here for ROBV - has concern for STDs as had sex with the baby's father but they had not been together so she wants to make sure she didn't catch anything- no vaginal discharge - irregular contractions - no vb, LOF. +FM - still smoking tobacco but has quit smoking mjn  A/P - counseled about drug use - states has not had any MJN in the last month - UDS collected - gc/chl and wet prep done today - f/u in 2 weeks - PTL precautions discussed.

## 2013-06-29 NOTE — Patient Instructions (Signed)

## 2013-06-29 NOTE — Addendum Note (Signed)
Addended by: Franchot Mimes on: 06/29/2013 04:59 PM   Modules accepted: Orders

## 2013-06-30 LAB — WET PREP, GENITAL
Trich, Wet Prep: NONE SEEN
Yeast Wet Prep HPF POC: NONE SEEN

## 2013-06-30 LAB — DRUG SCREEN, URINE
Amphetamine Screen, Ur: NEGATIVE
Marijuana Metabolite: POSITIVE — AB
Methadone: NEGATIVE
Opiates: NEGATIVE
Phencyclidine (PCP): NEGATIVE
Propoxyphene: NEGATIVE

## 2013-06-30 LAB — GC/CHLAMYDIA PROBE AMP: CT Probe RNA: NEGATIVE

## 2013-06-30 LAB — POCT URINALYSIS DIP (DEVICE)
Bilirubin Urine: NEGATIVE
Hgb urine dipstick: NEGATIVE
Ketones, ur: NEGATIVE mg/dL
Leukocytes, UA: NEGATIVE
Protein, ur: 30 mg/dL — AB
pH: 6.5 (ref 5.0–8.0)

## 2013-07-12 ENCOUNTER — Telehealth: Payer: Self-pay | Admitting: *Deleted

## 2013-07-12 MED ORDER — METRONIDAZOLE 500 MG PO TABS: 500.0000 mg | ORAL_TABLET | Freq: Two times a day (BID) | ORAL | Status: DC

## 2013-07-12 NOTE — Telephone Encounter (Signed)
Pt left message stating that she would like her test results form last visit. I called her and informed her of negative results for gonorrhea and chlamydia.  She states she is having a slight vaginal odor. I stated that her wet prep shows that she has BV and she can receive medication for this problem.  Pt voiced understanding.  I sent Rx to her pharmacy.

## 2013-07-13 ENCOUNTER — Encounter: Payer: Self-pay | Admitting: Obstetrics and Gynecology

## 2013-07-21 ENCOUNTER — Ambulatory Visit (INDEPENDENT_AMBULATORY_CARE_PROVIDER_SITE_OTHER): Payer: Self-pay | Admitting: Advanced Practice Midwife

## 2013-07-21 VITALS — BP 114/70 | Temp 97.3°F | Wt 160.0 lb

## 2013-07-21 DIAGNOSIS — O0930 Supervision of pregnancy with insufficient antenatal care, unspecified trimester: Secondary | ICD-10-CM

## 2013-07-21 LAB — POCT URINALYSIS DIP (DEVICE)
Ketones, ur: NEGATIVE mg/dL
Protein, ur: NEGATIVE mg/dL
Specific Gravity, Urine: 1.015 (ref 1.005–1.030)
Urobilinogen, UA: 0.2 mg/dL (ref 0.0–1.0)
pH: 6.5 (ref 5.0–8.0)

## 2013-07-21 MED ORDER — METRONIDAZOLE 500 MG PO TABS: 500.0000 mg | ORAL_TABLET | Freq: Two times a day (BID) | ORAL | Status: DC

## 2013-07-21 NOTE — Progress Notes (Signed)
Doing well.  Good fetal movement, denies vaginal bleeding, LOF, regular contractions.  Reports pelvic pressure and intermittent cramping.  Still having clear discharge with odor.  Unable to pick up Flagyl r/t cost.  Reprinted so pt can take to [facility] for $4 list.

## 2013-07-21 NOTE — Progress Notes (Signed)
Pulse: 67 Pt didn't pick up flagyl, couldn't afford it.

## 2013-07-22 LAB — GC/CHLAMYDIA PROBE AMP: CT Probe RNA: NEGATIVE

## 2013-07-26 ENCOUNTER — Ambulatory Visit (INDEPENDENT_AMBULATORY_CARE_PROVIDER_SITE_OTHER): Payer: Self-pay | Admitting: Family Medicine

## 2013-07-26 VITALS — BP 111/75 | Temp 97.7°F | Wt 160.0 lb

## 2013-07-26 DIAGNOSIS — Z23 Encounter for immunization: Secondary | ICD-10-CM

## 2013-07-26 DIAGNOSIS — Z3480 Encounter for supervision of other normal pregnancy, unspecified trimester: Secondary | ICD-10-CM

## 2013-07-26 DIAGNOSIS — Z3483 Encounter for supervision of other normal pregnancy, third trimester: Secondary | ICD-10-CM

## 2013-07-26 LAB — POCT URINALYSIS DIP (DEVICE)
Bilirubin Urine: NEGATIVE
Hgb urine dipstick: NEGATIVE
Ketones, ur: 15 mg/dL — AB
Protein, ur: NEGATIVE mg/dL
Specific Gravity, Urine: 1.02 (ref 1.005–1.030)
pH: 7 (ref 5.0–8.0)

## 2013-07-26 MED ORDER — TETANUS-DIPHTH-ACELL PERTUSSIS 5-2.5-18.5 LF-MCG/0.5 IM SUSP: 0.5000 mL | Freq: Once | INTRAMUSCULAR | Status: DC

## 2013-07-26 MED ORDER — FERROUS GLUCONATE 324 (38 FE) MG PO TABS: 324.0000 mg | ORAL_TABLET | Freq: Two times a day (BID) | ORAL | Status: DC

## 2013-07-26 NOTE — Patient Instructions (Signed)

## 2013-07-26 NOTE — Addendum Note (Signed)
Addended by: Candelaria Stagers E on: 07/26/2013 01:52 PM   Modules accepted: Orders

## 2013-07-26 NOTE — Progress Notes (Signed)
P= 65 pt c/o pain/pressure at all times/Reports contractions on irregular basis/ Pt need prescription for Flagyl to take to [facility] $4.00 med.

## 2013-07-26 NOTE — Progress Notes (Signed)
+  FM, No LOF< no VB, No Ctx  Joanna [facility] is a 27 y.o. [id number] at [redacted]w[redacted]d here for ROB visit.  Discussed with Patient:  -Plans to breast/bottle feed.  All questions answered. -Continue prenatal vitamins. -Reviewed fetal kick counts Pt to perform daily at a time when the baby is active, lie laterally with both hands on belly in quiet room and count all movements (hiccups, shoulder rolls, obvious kicks, etc); pt is to report to clinic L&D for less than 10 movements felt in a one hour time period-pt told as soon as she counts 10 movements the count is complete.  - Routine precautions discussed (depression, infection s/s).   Patient provided with all pertinent phone numbers for emergencies. - RTC for any VB, regular, painful cramps/ctxs occurring at a rate of >2/10 min, fever (100.5 or higher), n/v/d, any pain that is unresolving or worsening, LOF, decreased fetal movement, CP, SOB, edema - RTC in 1 weeks for next appt.  Problems: Patient Active Problem List   Diagnosis Date Noted  . Anemia complicating pregnancy in third trimester 06/01/2013  . Drug dependence complicating pregnancy in third trimester 05/31/2013  . GBS (group B streptococcus) UTI complicating pregnancy 05/06/2013  . Supervision of other normal pregnancy 05/03/2013  . Insufficient prenatal care in second trimester 05/03/2013  . Smoker 05/03/2013  . Tobacco smoking complicating pregnancy 05/03/2013    To Do: 1. Tdap, flu will be given today 2. Labor bag packed  [ ]  Vaccines: TODAY Flu:  Tdap:  [ ]  BCM: Mirena [ ]  Readiness: baby has a place to sleep, car seat, other baby necessities.  Edu: [ x] PTL precautions; [x ] BF class; [ ]  childbirth class; [ ]   BF counseling;  Tawana Scale, MD OB Fellow

## 2013-07-27 ENCOUNTER — Encounter: Payer: Self-pay | Admitting: *Deleted

## 2013-08-02 ENCOUNTER — Encounter: Payer: Self-pay | Admitting: Family Medicine

## 2013-08-02 ENCOUNTER — Ambulatory Visit (INDEPENDENT_AMBULATORY_CARE_PROVIDER_SITE_OTHER): Payer: Self-pay | Admitting: Family Medicine

## 2013-08-02 VITALS — BP 125/78 | Temp 97.8°F | Wt 159.0 lb

## 2013-08-02 DIAGNOSIS — Z3483 Encounter for supervision of other normal pregnancy, third trimester: Secondary | ICD-10-CM

## 2013-08-02 DIAGNOSIS — B951 Streptococcus, group B, as the cause of diseases classified elsewhere: Secondary | ICD-10-CM

## 2013-08-02 DIAGNOSIS — O0932 Supervision of pregnancy with insufficient antenatal care, second trimester: Secondary | ICD-10-CM

## 2013-08-02 DIAGNOSIS — O2390 Unspecified genitourinary tract infection in pregnancy, unspecified trimester: Secondary | ICD-10-CM

## 2013-08-02 DIAGNOSIS — O0930 Supervision of pregnancy with insufficient antenatal care, unspecified trimester: Secondary | ICD-10-CM

## 2013-08-02 LAB — POCT URINALYSIS DIP (DEVICE)
Hgb urine dipstick: NEGATIVE
Protein, ur: NEGATIVE mg/dL
Specific Gravity, Urine: 1.02 (ref 1.005–1.030)
Urobilinogen, UA: 2 mg/dL — ABNORMAL HIGH (ref 0.0–1.0)
pH: 6 (ref 5.0–8.0)

## 2013-08-02 NOTE — Progress Notes (Signed)
P=79  Pt c/o pain/pressure/contactions every hour for one week.

## 2013-08-02 NOTE — Progress Notes (Signed)
S: 27 yo G3P2002 @ [redacted]w[redacted]d today - still having contractions regularly once an hour - no lof, vb.  +FM  O: see flowsheet  A/p: - labor precautions discussed - SVE unchanged from last time - TDAP and flu at last visit - f/u in 1 week

## 2013-08-02 NOTE — Patient Instructions (Signed)
Third Trimester of Pregnancy  The third trimester is from week 29 through week 42, months 7 through 9. The third trimester is a time when the fetus is growing rapidly. At the end of the ninth month, the fetus is about 20 inches in length and weighs 6 10 pounds.   BODY CHANGES  Your body goes through many changes during pregnancy. The changes vary from woman to woman.    Your weight will continue to increase. You can expect to gain 25 35 pounds (11 16 kg) by the end of the pregnancy.   You may begin to get stretch marks on your hips, abdomen, and breasts.   You may urinate more often because the fetus is moving lower into your pelvis and pressing on your bladder.   You may develop or continue to have heartburn as a result of your pregnancy.   You may develop constipation because certain hormones are causing the muscles that push waste through your intestines to slow down.   You may develop hemorrhoids or swollen, bulging veins (varicose veins).   You may have pelvic pain because of the weight gain and pregnancy hormones relaxing your joints between the bones in your pelvis. Back aches may result from over exertion of the muscles supporting your posture.   Your breasts will continue to grow and be tender. A yellow discharge may leak from your breasts called colostrum.   Your belly button may stick out.   You may feel short of breath because of your expanding uterus.   You may notice the fetus "dropping," or moving lower in your abdomen.   You may have a bloody mucus discharge. This usually occurs a few days to a week before labor begins.   Your cervix becomes thin and soft (effaced) near your due date.  WHAT TO EXPECT AT YOUR PRENATAL EXAMS   You will have prenatal exams every 2 weeks until week 36. Then, you will have weekly prenatal exams. During a routine prenatal visit:   You will be weighed to make sure you and the fetus are growing normally.   Your blood pressure is taken.   Your abdomen will be  measured to track your baby's growth.   The fetal heartbeat will be listened to.   Any test results from the previous visit will be discussed.   You may have a cervical check near your due date to see if you have effaced.  At around 36 weeks, your caregiver will check your cervix. At the same time, your caregiver will also perform a test on the secretions of the vaginal tissue. This test is to determine if a type of bacteria, Group B streptococcus, is present. Your caregiver will explain this further.  Your caregiver may ask you:   What your birth plan is.   How you are feeling.   If you are feeling the baby move.   If you have had any abnormal symptoms, such as leaking fluid, bleeding, severe headaches, or abdominal cramping.   If you have any questions.  Other tests or screenings that may be performed during your third trimester include:   Blood tests that check for low iron levels (anemia).   Fetal testing to check the health, activity level, and growth of the fetus. Testing is done if you have certain medical conditions or if there are problems during the pregnancy.  FALSE LABOR  You may feel small, irregular contractions that eventually go away. These are called Braxton Hicks contractions, or   false labor. Contractions may last for hours, days, or even weeks before true labor sets in. If contractions come at regular intervals, intensify, or become painful, it is best to be seen by your caregiver.   SIGNS OF LABOR    Menstrual-like cramps.   Contractions that are 5 minutes apart or less.   Contractions that start on the top of the uterus and spread down to the lower abdomen and back.   A sense of increased pelvic pressure or back pain.   A watery or bloody mucus discharge that comes from the vagina.  If you have any of these signs before the 37th week of pregnancy, call your caregiver right away. You need to go to the hospital to get checked immediately.  HOME CARE INSTRUCTIONS    Avoid all  smoking, herbs, alcohol, and unprescribed drugs. These chemicals affect the formation and growth of the baby.   Follow your caregiver's instructions regarding medicine use. There are medicines that are either safe or unsafe to take during pregnancy.   Exercise only as directed by your caregiver. Experiencing uterine cramps is a good sign to stop exercising.   Continue to eat regular, healthy meals.   Wear a good support bra for breast tenderness.   Do not use hot tubs, steam rooms, or saunas.   Wear your seat belt at all times when driving.   Avoid raw meat, uncooked cheese, cat litter boxes, and soil used by cats. These carry germs that can cause birth defects in the baby.   Take your prenatal vitamins.   Try taking a stool softener (if your caregiver approves) if you develop constipation. Eat more high-fiber foods, such as fresh vegetables or fruit and whole grains. Drink plenty of fluids to keep your urine clear or pale yellow.   Take warm sitz baths to soothe any pain or discomfort caused by hemorrhoids. Use hemorrhoid cream if your caregiver approves.   If you develop varicose veins, wear support hose. Elevate your feet for 15 minutes, 3 4 times a day. Limit salt in your diet.   Avoid heavy lifting, wear low heal shoes, and practice good posture.   Rest a lot with your legs elevated if you have leg cramps or low back pain.   Visit your dentist if you have not gone during your pregnancy. Use a soft toothbrush to brush your teeth and be gentle when you floss.   A sexual relationship may be continued unless your caregiver directs you otherwise.   Do not travel far distances unless it is absolutely necessary and only with the approval of your caregiver.   Take prenatal classes to understand, practice, and ask questions about the labor and delivery.   Make a trial run to the hospital.   Pack your hospital bag.   Prepare the baby's nursery.   Continue to go to all your prenatal visits as directed  by your caregiver.  SEEK MEDICAL CARE IF:   You are unsure if you are in labor or if your water has broken.   You have dizziness.   You have mild pelvic cramps, pelvic pressure, or nagging pain in your abdominal area.   You have persistent nausea, vomiting, or diarrhea.   You have a bad smelling vaginal discharge.   You have pain with urination.  SEEK IMMEDIATE MEDICAL CARE IF:    You have a fever.   You are leaking fluid from your vagina.   You have spotting or bleeding from your vagina.     You have severe abdominal cramping or pain.   You have rapid weight loss or gain.   You have shortness of breath with chest pain.   You notice sudden or extreme swelling of your face, hands, ankles, feet, or legs.   You have not felt your baby move in over an hour.   You have severe headaches that do not go away with medicine.   You have vision changes.  Document Released: 09/02/2001 Document Revised: 05/11/2013 Document Reviewed: 11/09/2012  [employer] Patient Information 2014 [employer].

## 2013-08-10 ENCOUNTER — Ambulatory Visit (INDEPENDENT_AMBULATORY_CARE_PROVIDER_SITE_OTHER): Payer: Self-pay | Admitting: Obstetrics and Gynecology

## 2013-08-10 VITALS — BP 125/73 | Temp 98.9°F | Wt 158.3 lb

## 2013-08-10 DIAGNOSIS — F1920 Other psychoactive substance dependence, uncomplicated: Secondary | ICD-10-CM

## 2013-08-10 DIAGNOSIS — O0930 Supervision of pregnancy with insufficient antenatal care, unspecified trimester: Secondary | ICD-10-CM

## 2013-08-10 DIAGNOSIS — O99330 Smoking (tobacco) complicating pregnancy, unspecified trimester: Secondary | ICD-10-CM

## 2013-08-10 DIAGNOSIS — O2390 Unspecified genitourinary tract infection in pregnancy, unspecified trimester: Secondary | ICD-10-CM

## 2013-08-10 DIAGNOSIS — Z3483 Encounter for supervision of other normal pregnancy, third trimester: Secondary | ICD-10-CM

## 2013-08-10 LAB — POCT URINALYSIS DIP (DEVICE)
Glucose, UA: NEGATIVE mg/dL
Hgb urine dipstick: NEGATIVE
Nitrite: NEGATIVE
Specific Gravity, Urine: 1.025 (ref 1.005–1.030)
Urobilinogen, UA: 0.2 mg/dL (ref 0.0–1.0)
pH: 6.5 (ref 5.0–8.0)

## 2013-08-10 NOTE — Patient Instructions (Addendum)
Vaginal Delivery During delivery, your health care provider will help you give birth to your baby. During a vaginal delivery, you will work to push the baby out of your vagina. However, before you can push your baby out, a few things need to happen. The opening of your uterus (cervix) has to soften, thin out, and open up (dilate) all the way to 10 cm. Also, your baby has to move down from the uterus into your vagina.  SIGNS OF LABOR  Your health care provider will first need to make sure you are in labor. Signs of labor include:   Passing what is called the mucous plug before labor begins. This is a small amount of blood-stained mucus.   Having regular, painful uterine contractions.   The time between contractions gets shorter.   The discomfort and pain gradually get more intense.  Contraction pains get worse when walking and do not go away when resting.   Your cervix becomes thinner (effacement) and dilates. BEFORE THE DELIVERY Once you are in labor and admitted into the hospital or care center, your health care provider may do the following:   Perform a complete physical exam.  Review any complications related to pregnancy or labor.  Check your blood pressure, pulse, temperature, and heart rate (vital signs).   Determine if, and when, the rupture of amniotic membranes occurred.  Do a vaginal exam (using a sterile glove and lubricant) to determine:   The position (presentation) of the baby. Is the baby's head presenting first (vertex) in the birth canal (vagina), or are the feet or buttocks first (breech)?   The level (station) of the baby's head within the birth canal.   The effacement and dilatation of the cervix.   An electronic fetal monitor is usually placed on your abdomen when you first arrive. This is used to monitor your contractions and the baby's heart rate.  When the monitor is on your abdomen (external fetal monitor), it can only pick up the frequency and  length of your contractions. It cannot tell the strength of your contractions.  If it becomes necessary for your health care provider to know exactly how strong your contractions are or to see exactly what the baby's heart rate is doing, an internal monitor may be inserted into your vagina and uterus. Your health care provider will discuss the benefits and risks of using an internal monitor and obtain your permission before inserting the device.  Continuous fetal monitoring may be needed if you have an epidural, are receiving certain medicines (such as oxytocin), or have pregnancy or labor complications.  An IV access tube may be placed into a vein in your arm to deliver fluids and medicines if necessary. THREE STAGES OF LABOR AND DELIVERY Normal labor and delivery is divided into three stages. First Stage This stage starts when you begin to contract regularly and your cervix begins to efface and dilate. It ends when your cervix is completely open (fully dilated). The first stage is the longest stage of labor and can last from 3 hours to 15 hours.  Several methods are available to help with labor pain. You and your health care provider will decide which option is best for you. Options include:   Opioid medicines. These are strong pain medicines that you can get through your IV tube or as a shot into your muscle. These medicines lessen pain but do not make it go away completely.  Epidural. A medicine is given through a thin tube   that is inserted in your back. The medicine numbs the lower part of your body and prevents any pain in that area.  Paracervical pain medicine. This is an injection of an anesthetic on each side of your cervix.   You may request natural childbirth, which does not involve the use of pain medicines or an epidural during labor and delivery. Instead, you will use other things, such as breathing exercises, to help cope with the pain. Second Stage The second stage of labor  begins when your cervix is fully dilated at 10 cm. It continues until you push your baby down through the birth canal and the baby is born. This stage can take only minutes or several hours.  The location of your baby's head as it moves through the birth canal is reported as a number called a station. If the baby's head has not started its descent, the station is described as being at minus 3 ( 3). When your baby's head is at the zero station, it is at the middle of the birth canal and is engaged in the pelvis. The station of your baby helps indicate the progress of the second stage of labor.  When your baby is born, your health care provider may hold the baby with his or her head lowered to prevent amniotic fluid, mucus, and blood from getting into the baby's lungs. The baby's mouth and nose may be suctioned with a small bulb syringe to remove any additional fluid.  Your health care provider may then place the baby on your stomach. It is important to keep the baby from getting cold. To do this, the health care provider will dry the baby off, place the baby directly on your skin (with no blankets between you and the baby), and cover the baby with warm, dry blankets.   The umbilical cord is cut. Third Stage During the third stage of labor, your health care provider will deliver the placenta (afterbirth) and make sure your bleeding is under control. The delivery of the placenta usually takes about 5 minutes but can take up to 30 minutes. After the placenta is delivered, a medicine may be given either by IV or injection to help contract the uterus and control bleeding. If you are planning to breastfeed, you can try to do so now. After you deliver the placenta, your uterus should contract and get very firm. If your uterus does not remain firm, your health care provider will massage it. This is important because the contraction of the uterus helps cut off bleeding at the site where the placenta was attached  to your uterus. If your uterus does not contract properly and stay firm, you may continue to bleed heavily. If there is a lot of bleeding, medicines may be given to contract the uterus and stop the bleeding.  Document Released: 06/17/2008 Document Revised: 05/11/2013 Document Reviewed: 02/27/2013 [facility] Patient Information 2014 [place], [place]. Smoking Cessation Quitting smoking is important to your health and has many advantages. However, it is not always easy to quit since nicotine is a very addictive drug. Often times, people try 3 times or more before being able to quit. This document explains the best ways for you to prepare to quit smoking. Quitting takes hard work and a lot of effort, but you can do it. ADVANTAGES OF QUITTING SMOKING  You will live longer, feel better, and live better.  Your body will feel the impact of quitting smoking almost immediately.  Within 20 minutes, blood pressure  decreases. Your pulse returns to its normal level.  After 8 hours, carbon monoxide levels in the blood return to normal. Your oxygen level increases.  After 24 hours, the chance of having a heart attack starts to decrease. Your breath, hair, and body stop smelling like smoke.  After 48 hours, damaged nerve endings begin to recover. Your sense of taste and smell improve.  After 72 hours, the body is virtually free of nicotine. Your bronchial tubes relax and breathing becomes easier.  After 2 to 12 weeks, lungs can hold more air. Exercise becomes easier and circulation improves.  The risk of having a heart attack, stroke, cancer, or lung disease is greatly reduced.  After 1 year, the risk of coronary heart disease is cut in half.  After 5 years, the risk of stroke falls to the same as a nonsmoker.  After 10 years, the risk of lung cancer is cut in half and the risk of other cancers decreases significantly.  After 15 years, the risk of coronary heart disease drops, usually to the level of a  nonsmoker.  If you are pregnant, quitting smoking will improve your chances of having a healthy baby.  The people you live with, especially any children, will be healthier.  You will have extra money to spend on things other than cigarettes. QUESTIONS TO THINK ABOUT BEFORE ATTEMPTING TO QUIT You may want to talk about your answers with your caregiver.  Why do you want to quit?  If you tried to quit in the past, what helped and what did not?  What will be the most difficult situations for you after you quit? How will you plan to handle them?  Who can help you through the tough times? Your family? Friends? A caregiver?  What pleasures do you get from smoking? What ways can you still get pleasure if you quit? Here are some questions to ask your caregiver:  How can you help me to be successful at quitting?  What medicine do you think would be best for me and how should I take it?  What should I do if I need more help?  What is smoking withdrawal like? How can I get information on withdrawal? GET READY  Set a quit date.  Change your environment by getting rid of all cigarettes, ashtrays, matches, and lighters in your home, car, or work. Do not let people smoke in your home.  Review your past attempts to quit. Think about what worked and what did not. GET SUPPORT AND ENCOURAGEMENT You have a better chance of being successful if you have help. You can get support in many ways.  Tell your family, friends, and co-workers that you are going to quit and need their support. Ask them not to smoke around you.  Get individual, group, or telephone counseling and support. Programs are available at [employer] and health centers. Call your local health department for information about programs in your area.  Spiritual beliefs and practices may help some smokers quit.  Download a "quit meter" on your computer to keep track of quit statistics, such as how long you have gone without  smoking, cigarettes not smoked, and money saved.  Get a self-help book about quitting smoking and staying off of tobacco. LEARN NEW SKILLS AND BEHAVIORS  Distract yourself from urges to smoke. Talk to someone, go for a walk, or occupy your time with a task.  Change your normal routine. Take a different route to work. Drink tea instead of  coffee. Eat breakfast in a different place.  Reduce your stress. Take a hot bath, exercise, or read a book.  Plan something enjoyable to do every day. Reward yourself for not smoking.  Explore interactive web-based programs that specialize in helping you quit. GET MEDICINE AND USE IT CORRECTLY Medicines can help you stop smoking and decrease the urge to smoke. Combining medicine with the above behavioral methods and support can greatly increase your chances of successfully quitting smoking.  Nicotine replacement therapy helps deliver nicotine to your body without the negative effects and risks of smoking. Nicotine replacement therapy includes nicotine gum, lozenges, inhalers, nasal sprays, and skin patches. Some may be available over-the-counter and others require a prescription.  Antidepressant medicine helps people abstain from smoking, but how this works is unknown. This medicine is available by prescription.  Nicotinic receptor partial agonist medicine simulates the effect of nicotine in your brain. This medicine is available by prescription. Ask your caregiver for advice about which medicines to use and how to use them based on your health history. Your caregiver will tell you what side effects to look out for if you choose to be on a medicine or therapy. Carefully read the information on the package. Do not use any other product containing nicotine while using a nicotine replacement product.  RELAPSE OR DIFFICULT SITUATIONS Most relapses occur within the first 3 months after quitting. Do not be discouraged if you start smoking again. Remember, most  people try several times before finally quitting. You may have symptoms of withdrawal because your body is used to nicotine. You may crave cigarettes, be irritable, feel very hungry, cough often, get headaches, or have difficulty concentrating. The withdrawal symptoms are only temporary. They are strongest when you first quit, but they will go away within 10 14 days. To reduce the chances of relapse, try to:  Avoid drinking alcohol. Drinking lowers your chances of successfully quitting.  Reduce the amount of caffeine you consume. Once you quit smoking, the amount of caffeine in your body increases and can give you symptoms, such as a rapid heartbeat, sweating, and anxiety.  Avoid smokers because they can make you want to smoke.  Do not let weight gain distract you. Many smokers will gain weight when they quit, usually less than 10 pounds. Eat a healthy diet and stay active. You can always lose the weight gained after you quit.  Find ways to improve your mood other than smoking. FOR MORE INFORMATION  www.smokefree.gov  Document Released: 09/02/2001 Document Revised: 03/09/2012 Document Reviewed: 12/18/2011 [facility] Patient Information 2014 [place], [place].

## 2013-08-10 NOTE — Progress Notes (Signed)
Pulse- 75 Patient reports pelvic pain/pressure and irregular contractions, reports having contractions 4 minutes apart last night but they stopped after 2 hours

## 2013-08-10 NOTE — Progress Notes (Signed)
Anxious for labor and unfortunately less dilated by exam (ext loose, int 1). Dated by 6 wk [place]. Having late gestational discomforts and irreg UCs. Good FM. No LOF or VB. Smokes occasionally> rec quit,

## 2013-08-13 ENCOUNTER — Inpatient Hospital Stay (HOSPITAL_COMMUNITY)
Admission: AD | Admit: 2013-08-13 | Discharge: 2013-08-16 | DRG: 775 | Disposition: A | Discharge status: Home / Self Care | Payer: Medicaid Other | Source: Ambulatory Visit | Attending: Obstetrics & Gynecology | Admitting: Obstetrics & Gynecology

## 2013-08-13 ENCOUNTER — Encounter (HOSPITAL_COMMUNITY): Payer: Self-pay

## 2013-08-13 ENCOUNTER — Inpatient Hospital Stay (HOSPITAL_COMMUNITY): Payer: Medicaid Other | Admitting: Anesthesiology

## 2013-08-13 ENCOUNTER — Encounter (HOSPITAL_COMMUNITY): Payer: Medicaid Other | Admitting: Anesthesiology

## 2013-08-13 DIAGNOSIS — Z22330 Carrier of Group B streptococcus: Secondary | ICD-10-CM

## 2013-08-13 DIAGNOSIS — D649 Anemia, unspecified: Secondary | ICD-10-CM | POA: Diagnosis present

## 2013-08-13 DIAGNOSIS — O99323 Drug use complicating pregnancy, third trimester: Secondary | ICD-10-CM

## 2013-08-13 DIAGNOSIS — B951 Streptococcus, group B, as the cause of diseases classified elsewhere: Secondary | ICD-10-CM

## 2013-08-13 DIAGNOSIS — O0932 Supervision of pregnancy with insufficient antenatal care, second trimester: Secondary | ICD-10-CM

## 2013-08-13 DIAGNOSIS — O99333 Smoking (tobacco) complicating pregnancy, third trimester: Secondary | ICD-10-CM

## 2013-08-13 DIAGNOSIS — O0930 Supervision of pregnancy with insufficient antenatal care, unspecified trimester: Secondary | ICD-10-CM

## 2013-08-13 DIAGNOSIS — O99344 Other mental disorders complicating childbirth: Secondary | ICD-10-CM | POA: Diagnosis present

## 2013-08-13 DIAGNOSIS — F1210 Cannabis abuse, uncomplicated: Secondary | ICD-10-CM | POA: Diagnosis present

## 2013-08-13 DIAGNOSIS — O9902 Anemia complicating childbirth: Secondary | ICD-10-CM | POA: Diagnosis present

## 2013-08-13 DIAGNOSIS — O99334 Smoking (tobacco) complicating childbirth: Secondary | ICD-10-CM | POA: Diagnosis present

## 2013-08-13 DIAGNOSIS — Z3483 Encounter for supervision of other normal pregnancy, third trimester: Secondary | ICD-10-CM

## 2013-08-13 DIAGNOSIS — O99892 Other specified diseases and conditions complicating childbirth: Secondary | ICD-10-CM | POA: Diagnosis present

## 2013-08-13 HISTORY — DX: Other specified health status: Z78.9

## 2013-08-13 LAB — TYPE AND SCREEN
ABO/RH(D): O POS
Antibody Screen: NEGATIVE

## 2013-08-13 LAB — CBC
HCT: 30.4 % — ABNORMAL LOW (ref 36.0–46.0)
Hemoglobin: 10.2 g/dL — ABNORMAL LOW (ref 12.0–15.0)
MCHC: 33.6 g/dL (ref 30.0–36.0)
RDW: 14.1 % (ref 11.5–15.5)
WBC: 8.4 10*3/uL (ref 4.0–10.5)

## 2013-08-13 LAB — POCT FERN TEST: POCT Fern Test: POSITIVE

## 2013-08-13 MED ORDER — PHENYLEPHRINE 40 MCG/ML (10ML) SYRINGE FOR IV PUSH (FOR BLOOD PRESSURE SUPPORT)
80.0000 ug | PREFILLED_SYRINGE | INTRAVENOUS | Status: DC | PRN
Start: 2013-08-13 — End: 2013-08-14
  Filled 2013-08-13: qty 10
  Filled 2013-08-13: qty 2

## 2013-08-13 MED ORDER — FENTANYL 2.5 MCG/ML BUPIVACAINE 1/10 % EPIDURAL INFUSION (WH - ANES)
12.0000 mL/h | INTRAMUSCULAR | Status: DC | PRN
  Filled 2013-08-13: qty 125

## 2013-08-13 MED ORDER — DIPHENHYDRAMINE HCL 50 MG/ML IJ SOLN
12.5000 mg | INTRAMUSCULAR | Status: DC | PRN
  Administered 2013-08-14 (×2): 12.5 mg via INTRAVENOUS
  Filled 2013-08-13: qty 1

## 2013-08-13 MED ORDER — LIDOCAINE HCL (PF) 1 % IJ SOLN
INTRAMUSCULAR | Status: DC | PRN
  Administered 2013-08-13 (×2): 4 mL

## 2013-08-13 MED ORDER — HYDROXYZINE HCL 50 MG PO TABS
50.0000 mg | ORAL_TABLET | Freq: Four times a day (QID) | ORAL | Status: DC | PRN
  Filled 2013-08-13: qty 1

## 2013-08-13 MED ORDER — OXYTOCIN 40 UNITS IN LACTATED RINGERS INFUSION - SIMPLE MED
62.5000 mL/h | INTRAVENOUS | Status: DC
  Filled 2013-08-13: qty 1000

## 2013-08-13 MED ORDER — OXYCODONE-ACETAMINOPHEN 5-325 MG PO TABS
1.0000 | ORAL_TABLET | ORAL | Status: DC | PRN
Start: 2013-08-13 — End: 2013-08-14
  Administered 2013-08-14 (×4): 1 via ORAL
  Filled 2013-08-13 (×6): qty 1

## 2013-08-13 MED ORDER — ONDANSETRON HCL 4 MG/2ML IJ SOLN
4.0000 mg | Freq: Four times a day (QID) | INTRAMUSCULAR | Status: DC | PRN
  Administered 2013-08-13: 4 mg via INTRAVENOUS
  Filled 2013-08-13: qty 2

## 2013-08-13 MED ORDER — LACTATED RINGERS IV SOLN: INTRAVENOUS | Status: DC

## 2013-08-13 MED ORDER — PENICILLIN G POTASSIUM 5000000 UNITS IJ SOLR
2.5000 10*6.[IU] | INTRAVENOUS | Status: DC
  Filled 2013-08-13 (×7): qty 2.5

## 2013-08-13 MED ORDER — EPHEDRINE 5 MG/ML INJ
10.0000 mg | INTRAVENOUS | Status: DC | PRN
  Filled 2013-08-13: qty 4
  Filled 2013-08-13: qty 2

## 2013-08-13 MED ORDER — FLEET ENEMA 7-19 GM/118ML RE ENEM: 1.0000 | ENEMA | RECTAL | Status: DC | PRN

## 2013-08-13 MED ORDER — FENTANYL 2.5 MCG/ML BUPIVACAINE 1/10 % EPIDURAL INFUSION (WH - ANES)
INTRAMUSCULAR | Status: DC | PRN
  Administered 2013-08-13: 14 mL/h via EPIDURAL

## 2013-08-13 MED ORDER — ACETAMINOPHEN 325 MG PO TABS: 650.0000 mg | ORAL_TABLET | ORAL | Status: DC | PRN

## 2013-08-13 MED ORDER — IBUPROFEN 600 MG PO TABS
600.0000 mg | ORAL_TABLET | Freq: Four times a day (QID) | ORAL | Status: DC | PRN
  Administered 2013-08-14: 600 mg via ORAL
  Filled 2013-08-13: qty 1

## 2013-08-13 MED ORDER — PHENYLEPHRINE 40 MCG/ML (10ML) SYRINGE FOR IV PUSH (FOR BLOOD PRESSURE SUPPORT)
80.0000 ug | PREFILLED_SYRINGE | INTRAVENOUS | Status: DC | PRN
  Filled 2013-08-13: qty 2

## 2013-08-13 MED ORDER — OXYTOCIN BOLUS FROM INFUSION
500.0000 mL | INTRAVENOUS | Status: DC
  Administered 2013-08-14: 500 mL via INTRAVENOUS

## 2013-08-13 MED ORDER — EPHEDRINE 5 MG/ML INJ
10.0000 mg | INTRAVENOUS | Status: DC | PRN
  Filled 2013-08-13: qty 2

## 2013-08-13 MED ORDER — CITRIC ACID-SODIUM CITRATE 334-500 MG/5ML PO SOLN: 30.0000 mL | ORAL | Status: DC | PRN

## 2013-08-13 MED ORDER — PENICILLIN G POTASSIUM 5000000 UNITS IJ SOLR
5.0000 10*6.[IU] | Freq: Once | INTRAVENOUS | Status: AC
  Administered 2013-08-13: 5 10*6.[IU] via INTRAVENOUS
  Filled 2013-08-13: qty 5

## 2013-08-13 MED ORDER — LIDOCAINE HCL (PF) 1 % IJ SOLN
30.0000 mL | INTRAMUSCULAR | Status: DC | PRN
  Filled 2013-08-13 (×2): qty 30

## 2013-08-13 MED ORDER — LACTATED RINGERS IV SOLN
500.0000 mL | INTRAVENOUS | Status: DC | PRN
Start: 2013-08-13 — End: 2013-08-14
  Administered 2013-08-14: 300 mL via INTRAVENOUS

## 2013-08-13 MED ORDER — LACTATED RINGERS IV SOLN: 500.0000 mL | Freq: Once | INTRAVENOUS | Status: DC

## 2013-08-13 NOTE — H&P (Signed)
Attestation of Attending Supervision of Advanced Practitioner (PA/CNM/NP): Evaluation and management procedures were performed by the Advanced Practitioner under my supervision and collaboration.  I have reviewed the Advanced Practitioner's note and chart, and I agree with the management and plan.  Caedyn Raygoza, MD, FACOG Attending Obstetrician & Gynecologist Faculty Practice, [facility]  

## 2013-08-13 NOTE — H&P (Signed)
Joanna Melton is a 27 y.o. G60P2002 female at [redacted]w[redacted]d by 5.5wk u/s, presenting for SROM clear fluid at 2030 w/ uc's. Reports good fm.  Denies vb. Initiated pnc at [facility] @ 24wks. Too late for genetic screening, anatomy u/s normal, 1hr glucola 84, gbs pos urine. Pregnancy complicated by late onset of care, smoker, THC use, and anemia on iron. Exam in clinic on 11/19 1/40/-3. H/O 2 term svd's, largest infant weighing 5lb 14oz. Does not have custody of these children.   History OB History   Grav Para Term Preterm Abortions TAB SAB Ect Mult Living   3 2 2  0 0 0 0 0 0 2     Past Medical History  Diagnosis Date  . Chlamydia   . Medical history non-contributory    Past Surgical History  Procedure Laterality Date  . No past surgeries     Family History: family history includes Diabetes in her maternal grandfather; Hypertension in her maternal grandfather and mother. Social History:  reports that she has been smoking Cigarettes.  She has a 5 pack-year smoking history. She has never used smokeless tobacco. She reports that she drinks alcohol. She reports that she uses illicit drugs (Marijuana).   Review of Systems  Constitutional: Negative.   HENT: Negative.   Eyes: Negative.   Respiratory: Negative.   Cardiovascular: Negative.   Gastrointestinal: Positive for abdominal pain (uc's).  Genitourinary: Negative.   Musculoskeletal: Negative.   Skin: Negative.   Neurological: Negative.   Endo/Heme/Allergies: Negative.   Psychiatric/Behavioral: Negative.     Dilation: 4.5 Effacement (%): 80 Station: -2 Exam by:: Judeth Horn RNC There were no vitals taken for this visit. Maternal Exam:  Uterine Assessment: Contraction strength is mild.  Contraction frequency is regular.   Abdomen: Fetal presentation: vertex     Fetal Exam Fetal Monitor Review: Mode: ultrasound.   Baseline rate: 145.  Variability: moderate (6-25 bpm).   Pattern: accelerations present and no decelerations.     Fetal State Assessment: Category I - tracings are normal.     Physical Exam  Constitutional: She is oriented to person, place, and time. She appears well-developed and well-nourished.  HENT:  Head: Normocephalic.  Neck: Normal range of motion.  Cardiovascular: Normal rate and regular rhythm.   Respiratory: Effort normal and breath sounds normal.  GI: Soft. There is tenderness.  gravid  Genitourinary:  SVE by MAU RN: 4-5/80/-2 [name] pos  Musculoskeletal: Normal range of motion.  Neurological: She is alert and oriented to person, place, and time.  Skin: Skin is warm and dry.  Psychiatric: She has a normal mood and affect. Her behavior is normal. Judgment and thought content normal.    Prenatal labs: ABO, Rh: O/POS/-- (08/12 1510) Antibody: NEG (08/12 1510) Rubella: 1.52 (08/12 1510) RPR: NON REAC (09/09 1152)  HBsAg: NEGATIVE (08/12 1510)  HIV: NON REACTIVE (08/12 1510)  GBS: Positive (11/22 0000)   Assessment/Plan: A:   109w0d SIUP  G3P2002   SROM w/ early labor  Cat I FHR  GBS pos  THC use during pregnancy  Smoker  Late onset prenatal care  Doesn't have custody of 2 other children  P:   Admit to BS  IV pain meds/epidural prn  PCN for gbs pos  Expectant management  Anticipate NSVD  Will need pp SW consult d/t late onset care, THC use, no custody of other children   Marge Duncans 08/13/2013, 9:38 PM

## 2013-08-13 NOTE — Anesthesia Preprocedure Evaluation (Signed)
Anesthesia Evaluation  Patient identified by MRN, date of birth, ID band Patient awake    Reviewed: Allergy & Precautions, H&P , Patient's Chart, lab work & pertinent test results  Airway Mallampati: III TM Distance: >3 FB Neck ROM: full    Dental no notable dental hx. (+) Teeth Intact   Pulmonary Current Smoker,  breath sounds clear to auscultation  Pulmonary exam normal       Cardiovascular negative cardio ROS  Rhythm:regular Rate:Normal     Neuro/Psych negative neurological ROS  negative psych ROS   GI/Hepatic negative GI ROS, Neg liver ROS,   Endo/Other  negative endocrine ROS  Renal/GU negative Renal ROS  negative genitourinary   Musculoskeletal   Abdominal Normal abdominal exam  (+)   Peds  Hematology negative hematology ROS (+) anemia ,   Anesthesia Other Findings   Reproductive/Obstetrics (+) Pregnancy                           Anesthesia Physical Anesthesia Plan  ASA: II  Anesthesia Plan: Epidural   Post-op Pain Management:    Induction:   Airway Management Planned:   Additional Equipment:   Intra-op Plan:   Post-operative Plan:   Informed Consent: I have reviewed the patients History and Physical, chart, labs and discussed the procedure including the risks, benefits and alternatives for the proposed anesthesia with the patient or authorized representative who has indicated his/her understanding and acceptance.     Plan Discussed with: Anesthesiologist  Anesthesia Plan Comments:         Anesthesia Quick Evaluation

## 2013-08-13 NOTE — MAU Note (Signed)
Report called to Dana RN in BS.  

## 2013-08-13 NOTE — Anesthesia Procedure Notes (Signed)
Epidural Patient location during procedure: OB Start time: 08/13/2013 10:23 PM  Staffing Anesthesiologist: Aubryn Spinola A. Performed by: anesthesiologist   Preanesthetic Checklist Completed: patient identified, site marked, surgical consent, pre-op evaluation, timeout performed, IV checked, risks and benefits discussed and monitors and equipment checked  Epidural Patient position: sitting Prep: site prepped and draped and DuraPrep Patient monitoring: continuous pulse ox and blood pressure Approach: midline Injection technique: LOR air  Needle:  Needle type: Tuohy  Needle gauge: 17 G Needle length: 9 cm and 9 Needle insertion depth: 4 cm Catheter type: closed end flexible Catheter size: 19 Gauge Catheter at skin depth: 9 cm Test dose: negative and Other  Assessment Events: blood not aspirated, injection not painful, no injection resistance, negative IV test and no paresthesia  Additional Notes Patient identified. Risks and benefits discussed including failed block, incomplete  Pain control, post dural puncture headache, nerve damage, paralysis, blood pressure Changes, nausea, vomiting, reactions to medications-both toxic and allergic and post Partum back pain. All questions were answered. Patient expressed understanding and wished to proceed. Sterile technique was used throughout procedure. Epidural site was Dressed with sterile barrier dressing. No paresthesias, signs of intravascular injection Or signs of intrathecal spread were encountered.  Patient was more comfortable after the epidural was dosed. Please see RN's note for documentation of vital signs and FHR which are stable.

## 2013-08-13 NOTE — MAU Note (Signed)
Gush of clear fluid x 30 mins & painful contractions this evening. Denies complications with this pregnancy. Requesting epidural at this time.

## 2013-08-14 ENCOUNTER — Encounter (HOSPITAL_COMMUNITY): Payer: Self-pay | Admitting: *Deleted

## 2013-08-14 DIAGNOSIS — D649 Anemia, unspecified: Secondary | ICD-10-CM

## 2013-08-14 DIAGNOSIS — O99334 Smoking (tobacco) complicating childbirth: Secondary | ICD-10-CM

## 2013-08-14 DIAGNOSIS — O99344 Other mental disorders complicating childbirth: Secondary | ICD-10-CM

## 2013-08-14 LAB — RAPID URINE DRUG SCREEN, HOSP PERFORMED
Amphetamines: NOT DETECTED
Opiates: NOT DETECTED
Tetrahydrocannabinol: POSITIVE — AB

## 2013-08-14 LAB — ABO/RH: ABO/RH(D): O POS

## 2013-08-14 MED ORDER — OXYTOCIN 40 UNITS IN LACTATED RINGERS INFUSION - SIMPLE MED: 62.5000 mL/h | INTRAVENOUS | Status: DC | PRN

## 2013-08-14 MED ORDER — SODIUM CHLORIDE 0.9 % IJ SOLN: 3.0000 mL | INTRAMUSCULAR | Status: DC | PRN

## 2013-08-14 MED ORDER — FLEET ENEMA 7-19 GM/118ML RE ENEM: 1.0000 | ENEMA | Freq: Every day | RECTAL | Status: DC | PRN

## 2013-08-14 MED ORDER — BISACODYL 10 MG RE SUPP: 10.0000 mg | Freq: Every day | RECTAL | Status: DC | PRN

## 2013-08-14 MED ORDER — SODIUM CHLORIDE 0.9 % IJ SOLN: 3.0000 mL | Freq: Two times a day (BID) | INTRAMUSCULAR | Status: DC

## 2013-08-14 MED ORDER — IBUPROFEN 600 MG PO TABS
600.0000 mg | ORAL_TABLET | Freq: Four times a day (QID) | ORAL | Status: DC
  Administered 2013-08-14 – 2013-08-16 (×10): 600 mg via ORAL
  Filled 2013-08-14 (×11): qty 1

## 2013-08-14 MED ORDER — OXYCODONE-ACETAMINOPHEN 5-325 MG PO TABS
1.0000 | ORAL_TABLET | ORAL | Status: DC | PRN
  Administered 2013-08-15 – 2013-08-16 (×6): 1 via ORAL
  Administered 2013-08-16: 2 via ORAL
  Filled 2013-08-14 (×6): qty 1
  Filled 2013-08-14: qty 2

## 2013-08-14 MED ORDER — TETANUS-DIPHTH-ACELL PERTUSSIS 5-2.5-18.5 LF-MCG/0.5 IM SUSP: 0.5000 mL | Freq: Once | INTRAMUSCULAR | Status: DC

## 2013-08-14 MED ORDER — LANOLIN HYDROUS EX OINT: TOPICAL_OINTMENT | CUTANEOUS | Status: DC | PRN

## 2013-08-14 MED ORDER — WITCH HAZEL-GLYCERIN EX PADS: 1.0000 "application " | MEDICATED_PAD | CUTANEOUS | Status: DC | PRN

## 2013-08-14 MED ORDER — BENZOCAINE-MENTHOL 20-0.5 % EX AERO
1.0000 "application " | INHALATION_SPRAY | CUTANEOUS | Status: DC | PRN
  Administered 2013-08-14: 1 via TOPICAL
  Filled 2013-08-14: qty 56

## 2013-08-14 MED ORDER — PRENATAL MULTIVITAMIN CH
1.0000 | ORAL_TABLET | Freq: Every day | ORAL | Status: DC
  Administered 2013-08-14 – 2013-08-16 (×3): 1 via ORAL
  Filled 2013-08-14 (×3): qty 1

## 2013-08-14 MED ORDER — ZOLPIDEM TARTRATE 5 MG PO TABS: 5.0000 mg | ORAL_TABLET | Freq: Every evening | ORAL | Status: DC | PRN

## 2013-08-14 MED ORDER — SENNOSIDES-DOCUSATE SODIUM 8.6-50 MG PO TABS
2.0000 | ORAL_TABLET | ORAL | Status: DC
  Administered 2013-08-15 – 2013-08-16 (×2): 2 via ORAL
  Filled 2013-08-14 (×2): qty 2

## 2013-08-14 MED ORDER — SODIUM CHLORIDE 0.9 % IV SOLN: 250.0000 mL | INTRAVENOUS | Status: DC | PRN

## 2013-08-14 MED ORDER — DIPHENHYDRAMINE HCL 25 MG PO CAPS: 25.0000 mg | ORAL_CAPSULE | Freq: Four times a day (QID) | ORAL | Status: DC | PRN

## 2013-08-14 MED ORDER — ONDANSETRON HCL 4 MG/2ML IJ SOLN: 4.0000 mg | INTRAMUSCULAR | Status: DC | PRN

## 2013-08-14 MED ORDER — FENTANYL CITRATE 0.05 MG/ML IJ SOLN: 100.0000 ug | INTRAMUSCULAR | Status: DC | PRN

## 2013-08-14 MED ORDER — DIBUCAINE 1 % RE OINT: 1.0000 "application " | TOPICAL_OINTMENT | RECTAL | Status: DC | PRN

## 2013-08-14 MED ORDER — SIMETHICONE 80 MG PO CHEW: 80.0000 mg | CHEWABLE_TABLET | ORAL | Status: DC | PRN

## 2013-08-14 MED ORDER — MEASLES, MUMPS & RUBELLA VAC ~~LOC~~ INJ
0.5000 mL | INJECTION | Freq: Once | SUBCUTANEOUS | Status: DC
  Filled 2013-08-14: qty 0.5

## 2013-08-14 MED ORDER — ONDANSETRON HCL 4 MG PO TABS: 4.0000 mg | ORAL_TABLET | ORAL | Status: DC | PRN

## 2013-08-14 NOTE — Anesthesia Postprocedure Evaluation (Signed)
  Anesthesia Post-op Note  Patient: Joanna Melton  Procedure(s) Performed: * No procedures listed *  Patient Location: Mother/Baby  Anesthesia Type:Epidural  Level of Consciousness: awake and alert   Airway and Oxygen Therapy: Patient Spontanous Breathing  Post-op Pain: mild  Post-op Assessment: Patient's Cardiovascular Status Stable, Respiratory Function Stable, No signs of Nausea or vomiting, Pain level controlled, No headache and No residual motor weakness  Post-op Vital Signs: stable  Complications: No apparent anesthesia complications

## 2013-08-14 NOTE — Lactation Note (Signed)
This note was copied from the chart of Joanna Melton. Lactation Consultation Note  Patient Name: Joanna Melton Date: 08/14/2013 Reason for consult: Initial assessment Mom has history of marijuana use, positive UDS. Per Peds, Mom is instructed to pump and dump for 30 days. Mom wants to breast and bottle feed. Baby was finishing a feeding at the breast when I arrived for this visit. Reviewed risk of BF while using marijuana, hand out reviewed with Mom. Discussed recommendations. Mom reports if she pumps she does not want to throw her milk out, so she is considering changing to formula and bottle feeding. Advised Mom if she would like to pump to notify RN to set her up with DEBP. Otherwise it is recommended to start formula 15 ml for today, increased per hours of age per guidelines.   Maternal Data    Feeding Feeding Type: Breast Fed Length of feed: 15 min  LATCH Score/Interventions Latch: Grasps breast easily, tongue down, lips flanged, rhythmical sucking.  Audible Swallowing: None  Type of Nipple: Everted at rest and after stimulation  Comfort (Breast/Nipple): Soft / non-tender     Hold (Positioning): No assistance needed to correctly position infant at breast.  LATCH Score: 8  Lactation Tools Discussed/Used     Consult Status Consult Status: Follow-up Date: 08/14/13 Follow-up type: In-patient    Alfred Levins 08/14/2013, 5:18 PM

## 2013-08-14 NOTE — Progress Notes (Signed)
Clinical Social Work Department PSYCHOSOCIAL ASSESSMENT - MATERNAL/CHILD 08/14/2013  Patient:  Joanna Melton, Joanna Melton  Account Number:  [id number]  Admit Date:  08/13/2013  Marjo Bicker Name:   Derl Barrow Roxxon Mckey    Clinical Social Worker:  Elgie Maziarz, LCSW   Date/Time:  08/14/2013 11:00 AM  Date Referred:  08/13/2013   Referral source  Central Nursery     Referred reason  Substance Abuse   Other referral source:    I:  FAMILY / HOME ENVIRONMENT Child's legal guardian:  PARENT  Guardian - Name Guardian - Age Guardian - Address  RAFIA, SHEDDEN 25    Other household support members/support persons Other support:    II  PSYCHOSOCIAL DATA Information Source:    Event organiser Employment:   Mother is employed and plans to return to work   Surveyor, quantity resources:   If Medicaid - [place]:   Mother reports plan to apply for medicaid, WIC, and foodstams.  School / Grade:   Maternity Care Coordinator / Child Services Coordination / Early Interventions:  Cultural issues impacting care:    III  STRENGTHS  Strength comment:    IV  RISK FACTORS AND CURRENT PROBLEMS Current Problem:  YES   Risk Factor & Current Problem Patient Issue Family Issue Risk Factor / Current Problem Comment   Y N UDS on mother positive for marijuana    V  SOCIAL WORK ASSESSMENT Acknowledged order for Social Work consult to asses mother's hx of marijuana use during pregnancy.  She is a single parent with 2 other dependents ages 93 and 29. She is unsure of who the FOB is, because she stated that she was with 2 different guys during the time she became pregnant. She is currently living alone and reports family support. Informed that maternal grandmother has been caring for her other two children for over a year.  Mother states that she was living with her mother, but moved out and didn't have the resources to take her children with her and both she and maternal grandmother agreed that it  would be in the children's best interest if she allowed them remain in her home until she was able to adequately care for them.  She reports hx of DSS involvement several years ago when 25 year old was taken to the hospital for a skull fracture. Informed that the children were being cared for by a cousin at the time.  However, CPS still removed both children and they were not returned to her until 6 months later.  She denies having an open case with DSS.   She denies any hx of mental illness.  She admits to marijuana use 3-4 times a week during pregnancy.  Mother states that she used it to help her sleep at nights and to stimulate her appetite. She stated that she would not have used it during pregnancy otherwise.  She denies need for treatment.  Mother tested positive for marijuana 05/31/13, 06/29/13, and stated that she last used beginning of November.  She was informed that a report will most likely be made to DSS.  Awaiting results of UDS on newborn.  Mother informed of social work Surveyor, mining.      VI SOCIAL WORK PLAN Social Work Plan  Information/Referral to [facility]   Type of pt/family education:   If child protective services report - county:   If child protective services report - date:   Information/referral to community resources comment:   Other social work plan:  CSW will monitor drug screen on newborn    Clotilda Hafer J, LCSW

## 2013-08-14 NOTE — Progress Notes (Signed)
Mom continues to breast feed after staff told her she should not

## 2013-08-15 NOTE — Progress Notes (Signed)
Post Partum Day #1 Subjective: Pain well controlled, bleeding equal to normal menses, eating, drinking, voiding, + flatus.  Continues to breast feed, despite warnings of toxic effects of transmission of THC to baby  Objective: Blood pressure 103/56, pulse 69, temperature 98 F (36.7 C), temperature source Oral, resp. rate 20, height 5\' 2"  (1.575 m), weight 72.576 kg (160 lb), SpO2 100.00%, unknown if currently breastfeeding.  Physical Exam:  General: alert and no distress Lochia: appropriate Uterine Fundus: firm Incision: N/A DVT Evaluation: No evidence of DVT seen on physical exam. Negative Homan's sign. No significant calf/ankle edema.   Recent Labs  08/13/13 2115  HGB 10.2*  HCT 30.4*    Assessment/Plan: JANELIS STELZER is a 27 y.o. now [id number] PPD#1 s/p NSVD of viable baby boy at [redacted]w[redacted]d.  - Social work consulted for UDS + THC during pregnancy. H/o DSS involvement, but no active case.   - Await UDS on newborn  - DSS report likely to be made - Plan is to breast pump and discard milk for 30 days, bottle feed until that time.  - Nexplanon for contraception - Desires outpatient circumcision - Plan for discharge tomorrow.    LOS: 2 days   Hazeline Junker 08/15/2013, 7:47 AM

## 2013-08-15 NOTE — Progress Notes (Signed)
UR chart review completed.  

## 2013-08-15 NOTE — Progress Notes (Signed)
I examined pt and agree with documentation above and resident plan of care. Joanna Melton,Joanna Melton  

## 2013-08-16 ENCOUNTER — Encounter: Payer: Self-pay | Admitting: Obstetrics and Gynecology

## 2013-08-16 ENCOUNTER — Encounter (HOSPITAL_COMMUNITY): Payer: Self-pay | Admitting: *Deleted

## 2013-08-16 MED ORDER — IBUPROFEN 600 MG PO TABS: 600.0000 mg | ORAL_TABLET | Freq: Four times a day (QID) | ORAL | Status: DC

## 2013-08-16 NOTE — Progress Notes (Signed)
CPS worker came to meet with the pt & completed a safety assessment.  Pt is cleared by CPS to discharge home with infant when medically stable.  CSW will place a copy in infants paper chart. 

## 2013-08-16 NOTE — Progress Notes (Addendum)
CSW made a report to [place] CPS, due to her history & not having custody of other children. Will continue to monitor drug screen results.  

## 2013-08-16 NOTE — Discharge Summary (Signed)
Obstetric Discharge Summary Reason for Admission: onset of labor and rupture of membranes Prenatal Procedures: none; pregnancy complicated by being late to care, smoking, THC use, anemia (on iron) Intrapartum Procedures: spontaneous vaginal delivery Postpartum Procedures: none Complications-Operative and Postpartum: bilateral 1st degree periurethral lacerations, not requiring repair laceration Hemoglobin  Date Value Range Status  08/13/2013 10.2* 12.0 - 15.0 g/dL Final     HCT  Date Value Range Status  08/13/2013 30.4* 36.0 - 46.0 % Final   Hospital Course: 27 y.o. G3P3003 at [redacted]w[redacted]d admitted for SOL / SROM, delivered via NSVD without immediate postpartum complications and uncomplicated hospital course. Of note, pt does not have custody of two other children and has active THC use; SW has seen mother and DSS referral is likely to be made. Pt has breastfed some despite being advised not to, due to [facility] use.  Physical Exam:  General: alert, cooperative and no distress Lochia: appropriate Uterine Fundus: firm Incision: n/a DVT Evaluation: No evidence of DVT seen on physical exam. Negative Homan's sign. No cords or calf tenderness. No significant calf/ankle edema.  Discharge Diagnoses: Term Pregnancy-delivered and THC use, complicated social situation  Discharge Information: Date: 08/16/2013 Activity: pelvic rest Diet: routine Medications: PNV and Ibuprofen Condition: stable Instructions: refer to practice specific booklet Discharge to: home   Newborn Data: Live born female  Birth Weight: 6 lb 10 oz (3005 g) APGAR: 8, 9  Possibly home with mother, pending UDS and likely DSS referral (see hospital course above). Bottle feeding (see hospital course above). Mother planning Nexplanon for birth control.  Bobbye Morton, MD PGY-2, [facility] 08/16/2013, 7:23 AM  I have seen and examined this patient and agree with above documentation in the resident's note.  Pt doing well and d/c today. SW re-evaluated and cleared mom to d/c with baby.  Discharged home together. Agree with above otherwise.   Rulon Abide, M.D. [facility] Fellow 08/16/2013 4:54 PM

## 2013-08-16 NOTE — Progress Notes (Signed)
CPS case was accepted & assigned to [facility].  She plans to come meet with the pt this evening or tomorrow morning.  CSW advised Noreene Larsson that infant will likely be ready for discharge tomorrow.

## 2013-08-17 NOTE — Discharge Summary (Signed)
Attestation of Attending Supervision of Advanced Practitioner (CNM/NP): Evaluation and management procedures were performed by the Advanced Practitioner under my supervision and collaboration.  I have reviewed the Advanced Practitioner's note and chart, and I agree with the management and plan.  Takima Encina 08/17/2013 9:40 AM

## 2013-08-19 ENCOUNTER — Encounter (HOSPITAL_COMMUNITY): Payer: Self-pay | Admitting: *Deleted

## 2013-08-19 ENCOUNTER — Inpatient Hospital Stay (HOSPITAL_COMMUNITY)
Admission: AD | Admit: 2013-08-19 | Discharge: 2013-08-19 | Disposition: A | Discharge status: Home / Self Care | Payer: Medicaid Other | Source: Ambulatory Visit | Attending: Obstetrics and Gynecology | Admitting: Obstetrics and Gynecology

## 2013-08-19 DIAGNOSIS — M25559 Pain in unspecified hip: Secondary | ICD-10-CM | POA: Insufficient documentation

## 2013-08-19 DIAGNOSIS — N949 Unspecified condition associated with female genital organs and menstrual cycle: Secondary | ICD-10-CM | POA: Insufficient documentation

## 2013-08-19 DIAGNOSIS — K644 Residual hemorrhoidal skin tags: Secondary | ICD-10-CM | POA: Insufficient documentation

## 2013-08-19 DIAGNOSIS — O99893 Other specified diseases and conditions complicating puerperium: Secondary | ICD-10-CM | POA: Insufficient documentation

## 2013-08-19 DIAGNOSIS — N909 Noninflammatory disorder of vulva and perineum, unspecified: Secondary | ICD-10-CM

## 2013-08-19 DIAGNOSIS — O878 Other venous complications in the puerperium: Secondary | ICD-10-CM | POA: Insufficient documentation

## 2013-08-19 DIAGNOSIS — K5900 Constipation, unspecified: Secondary | ICD-10-CM | POA: Insufficient documentation

## 2013-08-19 LAB — URINALYSIS, ROUTINE W REFLEX MICROSCOPIC
Bilirubin Urine: NEGATIVE
Glucose, UA: NEGATIVE mg/dL
Ketones, ur: NEGATIVE mg/dL
Protein, ur: NEGATIVE mg/dL
Urobilinogen, UA: 0.2 mg/dL (ref 0.0–1.0)
pH: 6 (ref 5.0–8.0)

## 2013-08-19 MED ORDER — POLYETHYLENE GLYCOL 3350 17 GM/SCOOP PO POWD: 17.0000 g | Freq: Every day | ORAL | Status: DC

## 2013-08-19 MED ORDER — PHENYLEPH-SHARK LIV O-GLYC-PET 0.25-3-12-18 % RE CREA: 1.0000 "application " | TOPICAL_CREAM | Freq: Two times a day (BID) | RECTAL | Status: DC | PRN

## 2013-08-19 NOTE — MAU Provider Note (Signed)
History     CSN: [id number]  Arrival date and time: 08/19/13 4098   First Provider Initiated Contact with Patient 08/19/13 0845      Chief Complaint  Patient presents with  . Vaginal pain-Postpartum    HPI Joanna Melton is a 27 y.o. [phone] no PPD#5 presents with complaints of rectal and vaginal pain since delivery. Pt reports that it has progressively worsened since discharge to where she has difficulty with ambulation. Pt also has some bilateral hip pain since delivery. Pt reports no bowel movement for 6 days and is now afraid to use the restroom 2/2 a hemorroid that occurred after delivery. Pt reports that she other wise has no complaints.  Pt had successful NSVD with a 1st degree tear that did not require repair. Pt is having persistent vaginal bleeding that is dark red and less than a period. Pt denies f/c, Pain with urination, increased frequency. Pt denies CP, SOb, N/V, Diarrhea. No complaints with breast or other concerns.  OB History   Grav Para Term Preterm Abortions TAB SAB Ect Mult Living   3 3 3  0 0 0 0 0 0 3      Past Medical History  Diagnosis Date  . Chlamydia   . Medical history non-contributory     Past Surgical History  Procedure Laterality Date  . No past surgeries      Family History  Problem Relation Age of Onset  . Hypertension Maternal Grandfather   . Diabetes Maternal Grandfather   . Hypertension Mother     History  Substance Use Topics  . Smoking status: Current Some Day Smoker -- 0.50 packs/day for 10 years    Types: Cigarettes  . Smokeless tobacco: Never Used  . Alcohol Use: Yes     Comment: occasional    Allergies: No Known Allergies  Facility-administered medications prior to admission  Medication Dose Route Frequency Provider Last Rate Last Dose  . TDaP (BOOSTRIX) injection 0.5 mL  0.5 mL Intramuscular Once Minta Balsam, MD       Prescriptions prior to admission  Medication Sig Dispense Refill  . cyclobenzaprine  (FLEXERIL) 10 MG tablet Take 1 tablet (10 mg total) by mouth 2 (two) times daily as needed for muscle spasms.  20 tablet  0  . ferrous gluconate (FERGON) 324 MG tablet Take 1 tablet (324 mg total) by mouth 2 (two) times daily with a meal.  90 tablet  3  . ibuprofen (ADVIL,MOTRIN) 600 MG tablet Take 1 tablet (600 mg total) by mouth every 6 (six) hours.  50 tablet  0  . Prenatal Vit-Fe Fumarate-FA (PRENATAL MULTIVITAMIN) TABS Take 1 tablet by mouth daily at 12 noon.        ROS  As above Physical Exam   Blood pressure 122/68, pulse 79, temperature 97.7 F (36.5 C), temperature source Oral, resp. rate 18, height 5\' 2"  (1.575 m), weight 65.772 kg (145 lb), last menstrual period 10/24/2012, SpO2 100.00%, currently breastfeeding.  Physical Exam VSS, mild distress laying on her side 2/2 pain PP abdomen, firm uterus appropriately tender at U-3. Palpable descending colon minimally tender. Pt with mild suprapubic pain as well. Vaginal exam: healthy pink tissue. Unable to appreciate any tears. Normal red/brown lochia from cervix. No caudal tenderness. Mild bilateral tenderness over bulbocavernosis. Some tenderness posteriorly, but no hard stool felt from vagina. Pt with external hemorhoid, small, tender to palpation.   08/19/2013 08:00  Color, Urine YELLOW  APPearance CLEAR  Specific Gravity, Urine 1.020  pH 6.0  Glucose NEGATIVE  Bilirubin Urine NEGATIVE  Ketones, ur NEGATIVE  Protein NEGATIVE  Urobilinogen, UA 0.2  Nitrite NEGATIVE  Leukocytes, UA NEGATIVE  Hgb urine dipstick MODERATE (A)  WBC, UA 0-2  RBC / HPF 0-2  Squamous Epithelial / LPF RARE    MAU Course  Procedures  MDM Pt with likely constipation 2/2 narcotic use in hospital and fear of using the restroom with hemorroid. Will give soap suds enema and monitor for improvement. No evidence of UTI on UA. Low concern for kidney stone with blood. Most likely related to PP status.  Pain/rectal pressure relieved after BM s/p enema.  Vaginal soreness remains, but is manageable and ambulation is easier.   Assessment and Plan   Joanna Melton is a 27 y.o. G3P3003 at PPD #5 s/p NSVD with expected vaginal soreness, constipation, and external hemorrhoid.   Rx miralax, titrate to daily BM Rx Preparation H prn Continue motrin 600mg  q6h prn Return to MAU criteria reviewed Discharge home  Ryan B. Jarvis Newcomer, MD, PGY-1 08/19/2013 11:11 AM  I spoke with and examined patient and agree with resident's note and plan of care. Expect continued improvement with regular Bowel movements. F/u in clinic for postpartum care. Tawana Scale, MD OB Fellow 08/19/2013 12:19 PM

## 2013-08-19 NOTE — MAU Note (Signed)
Delivered vaginally on Sunday  (23rd) and discharged on Tuesday (25th); c/o vaginal  & rectal throbbing since Tuesday that has progressively worse;

## 2013-08-20 NOTE — MAU Provider Note (Signed)
Attestation of Attending Supervision of Advanced Practitioner: Evaluation and management procedures were performed by the PA/NP/CNM/OB Fellow under my supervision/collaboration. Chart reviewed and agree with management and plan.  Ninoshka Wainwright V 08/20/2013 10:11 PM    

## 2013-08-30 ENCOUNTER — Encounter: Payer: Self-pay | Admitting: Obstetrics and Gynecology

## 2013-09-26 ENCOUNTER — Ambulatory Visit: Payer: Medicaid Other | Admitting: Obstetrics and Gynecology

## 2013-11-21 ENCOUNTER — Encounter (HOSPITAL_COMMUNITY): Payer: Self-pay | Admitting: Emergency Medicine

## 2013-11-21 ENCOUNTER — Emergency Department (HOSPITAL_COMMUNITY)
Admission: EM | Admit: 2013-11-21 | Discharge: 2013-11-22 | Disposition: A | Discharge status: Home / Self Care | Payer: Medicaid Other | Attending: Emergency Medicine | Admitting: Emergency Medicine

## 2013-11-21 DIAGNOSIS — N760 Acute vaginitis: Secondary | ICD-10-CM | POA: Insufficient documentation

## 2013-11-21 DIAGNOSIS — Z3202 Encounter for pregnancy test, result negative: Secondary | ICD-10-CM | POA: Insufficient documentation

## 2013-11-21 DIAGNOSIS — N739 Female pelvic inflammatory disease, unspecified: Secondary | ICD-10-CM | POA: Insufficient documentation

## 2013-11-21 DIAGNOSIS — Z79899 Other long term (current) drug therapy: Secondary | ICD-10-CM | POA: Insufficient documentation

## 2013-11-21 DIAGNOSIS — B9689 Other specified bacterial agents as the cause of diseases classified elsewhere: Secondary | ICD-10-CM | POA: Insufficient documentation

## 2013-11-21 DIAGNOSIS — F17200 Nicotine dependence, unspecified, uncomplicated: Secondary | ICD-10-CM | POA: Insufficient documentation

## 2013-11-21 DIAGNOSIS — Z8619 Personal history of other infectious and parasitic diseases: Secondary | ICD-10-CM | POA: Insufficient documentation

## 2013-11-21 DIAGNOSIS — A499 Bacterial infection, unspecified: Secondary | ICD-10-CM | POA: Insufficient documentation

## 2013-11-21 DIAGNOSIS — R109 Unspecified abdominal pain: Secondary | ICD-10-CM | POA: Insufficient documentation

## 2013-11-21 LAB — CBC WITH DIFFERENTIAL/PLATELET
BASOS ABS: 0 10*3/uL (ref 0.0–0.1)
BASOS PCT: 0 % (ref 0–1)
EOS ABS: 0 10*3/uL (ref 0.0–0.7)
EOS PCT: 0 % (ref 0–5)
HEMATOCRIT: 35.7 % — AB (ref 36.0–46.0)
Hemoglobin: 12 g/dL (ref 12.0–15.0)
Lymphocytes Relative: 17 % (ref 12–46)
Lymphs Abs: 2.2 10*3/uL (ref 0.7–4.0)
MCH: 29.1 pg (ref 26.0–34.0)
MCHC: 33.6 g/dL (ref 30.0–36.0)
MCV: 86.7 fL (ref 78.0–100.0)
MONO ABS: 1 10*3/uL (ref 0.1–1.0)
Monocytes Relative: 8 % (ref 3–12)
Neutro Abs: 9.6 10*3/uL — ABNORMAL HIGH (ref 1.7–7.7)
Neutrophils Relative %: 74 % (ref 43–77)
Platelets: 300 10*3/uL (ref 150–400)
RBC: 4.12 MIL/uL (ref 3.87–5.11)
RDW: 14.3 % (ref 11.5–15.5)
WBC: 12.9 10*3/uL — ABNORMAL HIGH (ref 4.0–10.5)

## 2013-11-21 LAB — COMPREHENSIVE METABOLIC PANEL
ALT: 10 U/L (ref 0–35)
AST: 19 U/L (ref 0–37)
Albumin: 4.1 g/dL (ref 3.5–5.2)
Alkaline Phosphatase: 87 U/L (ref 39–117)
BUN: 11 mg/dL (ref 6–23)
CALCIUM: 10 mg/dL (ref 8.4–10.5)
CO2: 25 mEq/L (ref 19–32)
CREATININE: 0.93 mg/dL (ref 0.50–1.10)
Chloride: 104 mEq/L (ref 96–112)
GFR calc Af Amer: >90 mL/min (ref >90)
GFR calc non Af Amer: 84 mL/min — ABNORMAL LOW (ref >90)
Glucose, Bld: 94 mg/dL (ref 70–99)
Potassium: 3.7 mEq/L (ref 3.7–5.3)
Sodium: 143 mEq/L (ref 137–147)
Total Bilirubin: 0.4 mg/dL (ref 0.3–1.2)
Total Protein: 7.9 g/dL (ref 6.0–8.3)

## 2013-11-21 NOTE — ED Notes (Signed)
Pt. reports vaginal discharge with low abdominal pain for 3 days , denies dysuria , no nausea /vomitting or diarrhea.

## 2013-11-22 LAB — WET PREP, GENITAL
TRICH WET PREP: NONE SEEN
Yeast Wet Prep HPF POC: NONE SEEN

## 2013-11-22 LAB — URINALYSIS, ROUTINE W REFLEX MICROSCOPIC
GLUCOSE, UA: NEGATIVE mg/dL
HGB URINE DIPSTICK: NEGATIVE
Ketones, ur: 40 mg/dL — AB
Leukocytes, UA: NEGATIVE
Nitrite: NEGATIVE
Protein, ur: NEGATIVE mg/dL
Specific Gravity, Urine: 1.03 (ref 1.005–1.030)
Urobilinogen, UA: 1 mg/dL (ref 0.0–1.0)
pH: 6.5 (ref 5.0–8.0)

## 2013-11-22 LAB — POC URINE PREG, ED: Preg Test, Ur: NEGATIVE

## 2013-11-22 LAB — GC/CHLAMYDIA PROBE AMP
CT Probe RNA: NEGATIVE
GC Probe RNA: NEGATIVE

## 2013-11-22 MED ORDER — CEFTRIAXONE SODIUM 250 MG IJ SOLR
250.0000 mg | Freq: Once | INTRAMUSCULAR | Status: AC
  Administered 2013-11-22: 250 mg via INTRAMUSCULAR
  Filled 2013-11-22: qty 250

## 2013-11-22 MED ORDER — METRONIDAZOLE 500 MG PO TABS: 500.0000 mg | ORAL_TABLET | Freq: Two times a day (BID) | ORAL | Status: DC

## 2013-11-22 MED ORDER — AZITHROMYCIN 250 MG PO TABS
1000.0000 mg | ORAL_TABLET | Freq: Once | ORAL | Status: AC
  Administered 2013-11-22: 1000 mg via ORAL
  Filled 2013-11-22: qty 4

## 2013-11-22 MED ORDER — OXYCODONE-ACETAMINOPHEN 5-325 MG PO TABS
2.0000 | ORAL_TABLET | Freq: Once | ORAL | Status: AC
  Administered 2013-11-22: 2 via ORAL
  Filled 2013-11-22: qty 2

## 2013-11-22 MED ORDER — OXYCODONE-ACETAMINOPHEN 5-325 MG PO TABS: 2.0000 | ORAL_TABLET | ORAL | Status: DC | PRN

## 2013-11-22 NOTE — ED Provider Notes (Signed)
CSN: [id number]     Arrival date & time 11/21/13  1915 History   First MD Initiated Contact with Patient 11/22/13 0001     Chief Complaint  Patient presents with  . Vaginal Discharge     (Consider location/radiation/quality/duration/timing/severity/associated sxs/prior Treatment) HPI Comments: Patient is a 28 year old female with no significant past medical history. She presents today with complaints of vaginal discharge and lower abdominal pain for the past 3 days. She states this started after intercourse which was unprotected with her long-time boyfriend. She denies any fevers or chills. She denies any bowel or bladder complaints. Her last menstrual period was last month and was normal.  Patient is a 28 y.o. female presenting with vaginal discharge. The history is provided by the patient.  Vaginal Discharge Quality:  Yellow Severity:  Moderate Onset quality:  Gradual Duration:  3 days Timing:  Constant Progression:  Worsening Chronicity:  New Context: after intercourse   Relieved by:  Nothing Worsened by:  Nothing tried Ineffective treatments:  None tried Associated symptoms: abdominal pain     Past Medical History  Diagnosis Date  . Chlamydia   . Medical history non-contributory    Past Surgical History  Procedure Laterality Date  . No past surgeries     Family History  Problem Relation Age of Onset  . Hypertension Maternal Grandfather   . Diabetes Maternal Grandfather   . Hypertension Mother    History  Substance Use Topics  . Smoking status: Current Some Day Smoker -- 0.50 packs/day for 10 years    Types: Cigarettes  . Smokeless tobacco: Never Used  . Alcohol Use: Yes     Comment: occasional   OB History   Grav Para Term Preterm Abortions TAB SAB Ect Mult Living   3 3 3  0 0 0 0 0 0 3     Review of Systems  Gastrointestinal: Positive for abdominal pain.  Genitourinary: Positive for vaginal discharge.  All other systems reviewed and are  negative.      Allergies  Review of patient's allergies indicates no known allergies.  Home Medications   Current Outpatient Rx  Name  Route  Sig  Dispense  Refill  . ferrous gluconate (FERGON) 324 MG tablet   Oral   Take 1 tablet (324 mg total) by mouth 2 (two) times daily with a meal.   90 tablet   3   . ibuprofen (ADVIL,MOTRIN) 600 MG tablet   Oral   Take 1 tablet (600 mg total) by mouth every 6 (six) hours.   50 tablet   0   . Prenatal Vit-Fe Fumarate-FA (PRENATAL MULTIVITAMIN) TABS   Oral   Take 1 tablet by mouth daily at 12 noon.          BP 104/55  Pulse 82  Temp(Src) 98.9 F (37.2 C) (Oral)  Resp 18  Wt 125 lb (56.7 kg)  SpO2 99%  LMP 10/24/2012 Physical Exam  Nursing note and vitals reviewed. Constitutional: She is oriented to person, place, and time. She appears well-developed and well-nourished. No distress.  HENT:  Head: Normocephalic and atraumatic.  Neck: Normal range of motion. Neck supple.  Cardiovascular: Normal rate and regular rhythm.  Exam reveals no gallop and no friction rub.   No murmur heard. Pulmonary/Chest: Effort normal and breath sounds normal. No respiratory distress. She has no wheezes.  Abdominal: Soft. Bowel sounds are normal. She exhibits no distension and no mass. There is tenderness.  There is tenderness to palpation in the right  lower quadrant, left lower quadrant, and suprapubic region. There is no rebound and no guarding.  Genitourinary: Uterus normal. Vaginal discharge found.  The external genitalia appears normal. There is a yellowish discharge present. There is tenderness to palpation in the left adnexal region. There is no cervical motion tenderness.  Musculoskeletal: Normal range of motion.  Neurological: She is alert and oriented to person, place, and time.  Skin: Skin is warm and dry. She is not diaphoretic.    ED Course  Procedures (including critical care time) Labs Review Labs Reviewed  CBC WITH DIFFERENTIAL  - Abnormal; Notable for the following:    WBC 12.9 (*)    HCT 35.7 (*)    Neutro Abs 9.6 (*)    All other components within normal limits  COMPREHENSIVE METABOLIC PANEL - Abnormal; Notable for the following:    GFR calc non Af Amer 84 (*)    All other components within normal limits  WET PREP, GENITAL  GC/CHLAMYDIA PROBE AMP  URINALYSIS, ROUTINE W REFLEX MICROSCOPIC   Imaging Review No results found.   EKG Interpretation None      MDM   Final diagnoses:  None    Patient presents with complaints of lower abdominal pain and vaginal discharge. Physical examination reveals a yellow-gray vaginal discharge along with tenderness of the lower abdomen. Workup reveals an white count of 12.9 with too numerous to count white cells and moderate clue cells on the wet prep. She will be treated with Rocephin and ceftriaxone for presumed PID. She will also be given Flagyl to treat for bacterial vaginosis. GC and Chlamydia cultures are pending. If these are abnormal patient will be notified and she will be advised to have her partner treated.    Geoffery Lyonsouglas Cory Kitt, MD 11/22/13 [phone]

## 2013-11-22 NOTE — ED Notes (Signed)
Pt A&Ox4, ambulatory at discharge. 

## 2013-11-22 NOTE — ED Notes (Signed)
Pt reports her friend Reece Leaderarab will be driving patient home.

## 2013-11-22 NOTE — Discharge Instructions (Signed)
Flagyl as prescribed.  Percocet as prescribed as needed for pain.  Return to the ER if you develop worsening pain, high fever, or vomiting with an inability to keep your medications down.  We will call you if your cultures required further treatment.   Pelvic Inflammatory Disease Pelvic inflammatory disease (PID) is an infection in some or all of the female organs. PID can be in the uterus, ovaries, fallopian tubes, or the surrounding tissues inside the lower belly area (pelvis). HOME CARE   If given, take your antibiotic medicine as told. Finish them even if you start to feel better.  Only take medicine as told by your doctor.  Do not have sex (intercourse) until treatment is done or as told by your doctor.  Tell your sex partner if you have PID. Your partner may need to be treated.  Keep all doctor visits. GET HELP RIGHT AWAY IF:   You have a fever.  You have more belly (abdominal) or lower belly pain.  You have chills.  You have pain when you pee (urinate).  You are not better after 72 hours.  You have more fluid (discharge) coming from your vagina or fluid that is not normal.  You need pain medicine from your doctor.  You throw up (vomit).  You cannot take your medicines.  Your partner has a sexually transmitted disease (STD). MAKE SURE YOU:   Understand these instructions.  Will watch your condition.  Will get help right away if you are not doing well or get worse. Document Released: 12/05/2008 Document Revised: 01/03/2013 Document Reviewed: 09/04/2011 [facility] Patient Information 2014 [place], [place].  Bacterial Vaginosis Bacterial vaginosis is a vaginal infection that occurs when the normal balance of bacteria in the vagina is disrupted. It results from an overgrowth of certain bacteria. This is the most common vaginal infection in women of childbearing age. Treatment is important to prevent complications, especially in pregnant women, as it can cause a  premature delivery. CAUSES  Bacterial vaginosis is caused by an increase in harmful bacteria that are normally present in smaller amounts in the vagina. Several different kinds of bacteria can cause bacterial vaginosis. However, the reason that the condition develops is not fully understood. RISK FACTORS Certain activities or behaviors can put you at an increased risk of developing bacterial vaginosis, including:  Having a new sex partner or multiple sex partners.  Douching.  Using an intrauterine device (IUD) for contraception. Women do not get bacterial vaginosis from toilet seats, bedding, swimming pools, or contact with objects around them. SIGNS AND SYMPTOMS  Some women with bacterial vaginosis have no signs or symptoms. Common symptoms include:  Grey vaginal discharge.  A fishlike odor with discharge, especially after sexual intercourse.  Itching or burning of the vagina and vulva.  Burning or pain with urination. DIAGNOSIS  Your health care provider will take a medical history and examine the vagina for signs of bacterial vaginosis. A sample of vaginal fluid may be taken. Your health care provider will look at this sample under a microscope to check for bacteria and abnormal cells. A vaginal pH test may also be done.  TREATMENT  Bacterial vaginosis may be treated with antibiotic medicines. These may be given in the form of a pill or a vaginal cream. A second round of antibiotics may be prescribed if the condition comes back after treatment.  HOME CARE INSTRUCTIONS   Only take over-the-counter or prescription medicines as directed by your health care provider.  If antibiotic medicine  was prescribed, take it as directed. Make sure you finish it even if you start to feel better.  Do not have sex until treatment is completed.  Tell all sexual partners that you have a vaginal infection. They should see their health care provider and be treated if they have problems, such as a mild  rash or itching.  Practice safe sex by using condoms and only having one sex partner. SEEK MEDICAL CARE IF:   Your symptoms are not improving after 3 days of treatment.  You have increased discharge or pain.  You have a fever. MAKE SURE YOU:   Understand these instructions.  Will watch your condition.  Will get help right away if you are not doing well or get worse. FOR MORE INFORMATION  Centers for Disease Control and Prevention, Division of STD Prevention: SolutionApps.co.zawww.cdc.gov/std American Sexual Health Association (ASHA): www.ashastd.org  Document Released: 09/08/2005 Document Revised: 06/29/2013 Document Reviewed: 04/20/2013 [facility] Patient Information 2014 [place]

## 2013-11-22 NOTE — ED Notes (Signed)
Dr. Delo at bedside. 

## 2014-02-16 ENCOUNTER — Emergency Department (HOSPITAL_COMMUNITY)
Admission: EM | Admit: 2014-02-16 | Discharge: 2014-02-16 | Disposition: A | Discharge status: Home / Self Care | Payer: Medicaid Other | Attending: Emergency Medicine | Admitting: Emergency Medicine

## 2014-02-16 ENCOUNTER — Encounter (HOSPITAL_COMMUNITY): Payer: Self-pay | Admitting: Emergency Medicine

## 2014-02-16 ENCOUNTER — Emergency Department (HOSPITAL_COMMUNITY): Payer: Medicaid Other

## 2014-02-16 DIAGNOSIS — N898 Other specified noninflammatory disorders of vagina: Secondary | ICD-10-CM | POA: Insufficient documentation

## 2014-02-16 DIAGNOSIS — Z331 Pregnant state, incidental: Secondary | ICD-10-CM

## 2014-02-16 DIAGNOSIS — Z8619 Personal history of other infectious and parasitic diseases: Secondary | ICD-10-CM | POA: Insufficient documentation

## 2014-02-16 DIAGNOSIS — F17200 Nicotine dependence, unspecified, uncomplicated: Secondary | ICD-10-CM | POA: Insufficient documentation

## 2014-02-16 DIAGNOSIS — J3489 Other specified disorders of nose and nasal sinuses: Secondary | ICD-10-CM | POA: Insufficient documentation

## 2014-02-16 DIAGNOSIS — R42 Dizziness and giddiness: Secondary | ICD-10-CM | POA: Insufficient documentation

## 2014-02-16 LAB — WET PREP, GENITAL
CLUE CELLS WET PREP: NONE SEEN
TRICH WET PREP: NONE SEEN
WBC, Wet Prep HPF POC: NONE SEEN
YEAST WET PREP: NONE SEEN

## 2014-02-16 LAB — ABO/RH: ABO/RH(D): O POS

## 2014-02-16 LAB — CBC WITH DIFFERENTIAL/PLATELET
Basophils Absolute: 0 10*3/uL (ref 0.0–0.1)
Basophils Relative: 0 % (ref 0–1)
EOS PCT: 2 % (ref 0–5)
Eosinophils Absolute: 0.1 10*3/uL (ref 0.0–0.7)
HEMATOCRIT: 33.9 % — AB (ref 36.0–46.0)
HEMOGLOBIN: 11.5 g/dL — AB (ref 12.0–15.0)
LYMPHS PCT: 20 % (ref 12–46)
Lymphs Abs: 1.4 10*3/uL (ref 0.7–4.0)
MCH: 29.5 pg (ref 26.0–34.0)
MCHC: 33.9 g/dL (ref 30.0–36.0)
MCV: 86.9 fL (ref 78.0–100.0)
MONO ABS: 0.5 10*3/uL (ref 0.1–1.0)
MONOS PCT: 7 % (ref 3–12)
Neutro Abs: 5.1 10*3/uL (ref 1.7–7.7)
Neutrophils Relative %: 71 % (ref 43–77)
Platelets: 286 10*3/uL (ref 150–400)
RBC: 3.9 MIL/uL (ref 3.87–5.11)
RDW: 14.6 % (ref 11.5–15.5)
WBC: 7.1 10*3/uL (ref 4.0–10.5)

## 2014-02-16 LAB — RPR

## 2014-02-16 LAB — URINALYSIS, ROUTINE W REFLEX MICROSCOPIC
Bilirubin Urine: NEGATIVE
GLUCOSE, UA: NEGATIVE mg/dL
HGB URINE DIPSTICK: NEGATIVE
Ketones, ur: 15 mg/dL — AB
Leukocytes, UA: NEGATIVE
Nitrite: NEGATIVE
Protein, ur: NEGATIVE mg/dL
Specific Gravity, Urine: 1.028 (ref 1.005–1.030)
Urobilinogen, UA: 2 mg/dL — ABNORMAL HIGH (ref 0.0–1.0)
pH: 6.5 (ref 5.0–8.0)

## 2014-02-16 LAB — HCG, QUANTITATIVE, PREGNANCY: hCG, Beta Chain, Quant, S: 3107 m[IU]/mL — ABNORMAL HIGH (ref <5)

## 2014-02-16 LAB — BASIC METABOLIC PANEL
BUN: 9 mg/dL (ref 6–23)
CALCIUM: 9.1 mg/dL (ref 8.4–10.5)
CO2: 21 mEq/L (ref 19–32)
Chloride: 103 mEq/L (ref 96–112)
Creatinine, Ser: 0.82 mg/dL (ref 0.50–1.10)
GFR calc Af Amer: >90 mL/min (ref >90)
GFR calc non Af Amer: >90 mL/min (ref >90)
GLUCOSE: 87 mg/dL (ref 70–99)
Potassium: 3.5 mEq/L — ABNORMAL LOW (ref 3.7–5.3)
Sodium: 137 mEq/L (ref 137–147)

## 2014-02-16 LAB — POC URINE PREG, ED: Preg Test, Ur: POSITIVE — AB

## 2014-02-16 LAB — HIV ANTIBODY (ROUTINE TESTING W REFLEX): HIV 1&2 Ab, 4th Generation: NONREACTIVE

## 2014-02-16 MED ORDER — PRENATAL COMPLETE 14-0.4 MG PO TABS: 1.0000 | ORAL_TABLET | Freq: Every day | ORAL | Status: DC

## 2014-02-16 MED ORDER — LIDOCAINE HCL (PF) 1 % IJ SOLN
INTRAMUSCULAR | Status: AC
  Filled 2014-02-16: qty 5

## 2014-02-16 MED ORDER — CEFTRIAXONE SODIUM 250 MG IJ SOLR
250.0000 mg | Freq: Once | INTRAMUSCULAR | Status: AC
  Administered 2014-02-16: 250 mg via INTRAMUSCULAR
  Filled 2014-02-16: qty 250

## 2014-02-16 MED ORDER — AZITHROMYCIN 250 MG PO TABS
1000.0000 mg | ORAL_TABLET | Freq: Once | ORAL | Status: AC
  Administered 2014-02-16: 1000 mg via ORAL
  Filled 2014-02-16: qty 4

## 2014-02-16 MED ORDER — ACETAMINOPHEN 325 MG PO TABS
650.0000 mg | ORAL_TABLET | Freq: Once | ORAL | Status: AC
  Administered 2014-02-16: 650 mg via ORAL
  Filled 2014-02-16: qty 2

## 2014-02-16 NOTE — ED Provider Notes (Signed)
CSN: [id number]     Arrival date & time 02/16/14  1008 History   First MD Initiated Contact with Patient 02/16/14 1036     Chief Complaint  Patient presents with  . Headache  . Dizziness  . Possible Pregnancy     (Consider location/radiation/quality/duration/timing/severity/associated sxs/prior Treatment) The history is provided by the patient and medical records.   This is a 28 year old G3 P3, presenting to the ED for headache and intermittent lightheadedness. Patient states she feels that it is due to "sinus congestion"  As she has had clear rhinorrhea for the past several days.  No dizziness, lightheadedness, neck pain, photophobia, phonophobia, aura, visual disturbance, tinnitus, changes in speech, or ataxia.  Pt also notes that she has not had a period since March of this year. States her cycles are usually regular occurring every 28-30 days. She said she took a home pregnancy test last week, the results were very faint and she was unsure if it was positive. She denies any vaginal bleeding, loss of fluid, or abdominal pain. No nausea, vomiting, diarrhea, or breast pain. No urinary sx.  No fevers or chills. No sick contacts.  Patient is not currently established with an OB/GYN.  Expresses concern for possible STDs, has personal hx of chlamydia.  Recent encounter with father of baby 2 weeks ago, unsure of his STD status.  Does note small amount of vaginal discharge.  VS stable on arrival.  Past Medical History  Diagnosis Date  . Chlamydia   . Medical history non-contributory    Past Surgical History  Procedure Laterality Date  . No past surgeries     Family History  Problem Relation Age of Onset  . Hypertension Maternal Grandfather   . Diabetes Maternal Grandfather   . Hypertension Mother    History  Substance Use Topics  . Smoking status: Current Every Day Smoker -- 0.50 packs/day for 10 years    Types: Cigarettes  . Smokeless tobacco: Never Used  . Alcohol Use: Yes      Comment: occasional   OB History   Grav Para Term Preterm Abortions TAB SAB Ect Mult Living   3 3 3  0 0 0 0 0 0 3     Review of Systems  Genitourinary: Positive for menstrual problem.  Neurological: Positive for dizziness, light-headedness and headaches.  All other systems reviewed and are negative.     Allergies  Review of patient's allergies indicates no known allergies.  Home Medications   Prior to Admission medications   Medication Sig Start Date End Date Taking? Authorizing Provider  Chlorpheniramine Maleate (ALLERGY RELIEF PO) Take 1 tablet by mouth daily as needed (for allergies).   Yes Historical Provider, MD   BP 125/72  Pulse 89  Temp(Src) 98.1 F (36.7 C) (Oral)  Resp 18  Ht 5\' 2"  (1.575 m)  Wt 130 lb (58.968 kg)  BMI 23.77 kg/m2  SpO2 100%  LMP 11/26/2012  Breastfeeding? No  Physical Exam  Nursing note and vitals reviewed. Constitutional: She is oriented to person, place, and time. She appears well-developed and well-nourished. No distress.  HENT:  Head: Normocephalic and atraumatic.  Mouth/Throat: Oropharynx is clear and moist.  Eyes: Conjunctivae and EOM are normal. Pupils are equal, round, and reactive to light.  Neck: Normal range of motion and full passive range of motion without pain. Neck supple. No rigidity.  No meningeal signs  Cardiovascular: Normal rate, regular rhythm and normal heart sounds.   Pulmonary/Chest: Effort normal and breath sounds normal. No  respiratory distress. She has no wheezes.  Abdominal: Soft. Bowel sounds are normal. There is no tenderness. There is no guarding.  Genitourinary: There is no tenderness or lesion on the right labia. There is no tenderness or lesion on the left labia. Cervix exhibits no motion tenderness. Right adnexum displays no tenderness. Left adnexum displays no tenderness. No tenderness or bleeding around the vagina. No foreign body around the vagina. Vaginal discharge found.  Normal female external  genitalia without visible lesions; moderate amount of thick, white, vaginal discharge present; cervical os closed; no active bleeding; no adnexal or CMT  Musculoskeletal: Normal range of motion.  Neurological: She is alert and oriented to person, place, and time.  AAOx3, answering questions appropriately; equal strength UE and LE bilaterally; CN grossly intact; moves all extremities appropriately without ataxia; no focal neuro deficits or facial asymmetry appreciated  Skin: Skin is warm and dry. She is not diaphoretic.  Psychiatric: She has a normal mood and affect.    ED Course  Procedures (including critical care time) Labs Review Labs Reviewed  URINALYSIS, ROUTINE W REFLEX MICROSCOPIC - Abnormal; Notable for the following:    Ketones, ur 15 (*)    Urobilinogen, UA 2.0 (*)    All other components within normal limits  CBC WITH DIFFERENTIAL - Abnormal; Notable for the following:    Hemoglobin 11.5 (*)    HCT 33.9 (*)    All other components within normal limits  BASIC METABOLIC PANEL - Abnormal; Notable for the following:    Potassium 3.5 (*)    All other components within normal limits  POC URINE PREG, ED - Abnormal; Notable for the following:    Preg Test, Ur POSITIVE (*)    All other components within normal limits  WET PREP, GENITAL  GC/CHLAMYDIA PROBE AMP  HCG, QUANTITATIVE, PREGNANCY  HIV ANTIBODY (ROUTINE TESTING)  RPR  ABO/RH    Imaging Review Koreas Ob Comp Less 14 Wks  02/16/2014   CLINICAL DATA:  Pregnant  EXAM: OBSTETRIC <14 WK ULTRASOUND  TECHNIQUE: Transabdominal ultrasound was performed for evaluation of the gestation as well as the maternal uterus and adnexal regions.  COMPARISON:  None.  FINDINGS: Intrauterine gestational sac: Present  Yolk sac:  Not identified  Embryo:  Not identified  Cardiac Activity: Not identified  MSD: 7.1  mm   5 w   3  d    US EDC: 10/16/2014  Maternal uterus/adnexae: Normal ovaries.  No free fluid  IMPRESSION: 1. Single intrauterine  gestational sac. 2. Estimated gestational age by mean sac diameter equals 5 weeks 3 days.   Electronically Signed   By: Genevive BiStewart  Edmunds M.D.   On: 02/16/2014 14:43   Koreas Ob Transvaginal  02/16/2014   CLINICAL DATA:  Pregnant  EXAM: OBSTETRIC <14 WK ULTRASOUND  TECHNIQUE: Transabdominal ultrasound was performed for evaluation of the gestation as well as the maternal uterus and adnexal regions.  COMPARISON:  None.  FINDINGS: Intrauterine gestational sac: Present  Yolk sac:  Not identified  Embryo:  Not identified  Cardiac Activity: Not identified  MSD: 7.1  mm   5 w   3  d    US EDC: 10/16/2014  Maternal uterus/adnexae: Normal ovaries.  No free fluid  IMPRESSION: Single intrauterine gestational sac.  Estimated gestational age by mean sac diameter equals 5 weeks 3 days.   Electronically Signed   By: Genevive BiStewart  Edmunds M.D.   On: 02/16/2014 14:44     EKG Interpretation None  MDM   Final diagnoses:  Pregnancy as incidental finding   28 y.o. F complaining of headache and possible pregnancy.  LMP march 2015.  On exam, pt is afebrile and non-toxic appearing.  Neuro exam is intact without noted deficit-- low suspicion for [facility], ICH, TIA, stroke, or meningitis.  Urine preg +, pt now G4P3.  Pelvic exam with closed cervical os, no active bleeding noted.  Moderate amount of discharge without adnexal or CMT.  Pt is concerned for STD, will tx ppx in ED with rocephin and azithromycin.    Labs obtained, u/s pending.  Tylenol given for headache.  Labs obtained, H&H stable.  Ultrasound revealing single intrauterine gestational sac, estimated 5 weeks 3 days, quant sent.  HIV antibody and RPR pending.  Neuro exam remains intact.  Pt will FU with Women's OP clinic.  She was started on daily prenatal vitamins.  Discussed plan with patient, he/she acknowledged understanding and agreed with plan of care.  Return precautions given for new or worsening symptoms.  Garlon Hatchet, PA-C 02/16/14 1552  Garlon Hatchet,  PA-C 02/16/14 1556

## 2014-02-16 NOTE — ED Notes (Signed)
Pt was 'pull to full'. After triage, pt found to be Acuity 3. Will move to acute side.

## 2014-02-16 NOTE — ED Notes (Signed)
Patient transported to Ultrasound 

## 2014-02-16 NOTE — ED Notes (Signed)
Spoke with lab - they will run the hcg quan.

## 2014-02-16 NOTE — Discharge Instructions (Signed)
Take the prescribed medication as directed. Follow-up with women's OP clinic-- call and schedule appt.  Resource guide attached for other services you may need/want. Go to women's hospital for any pregnancy related issues-- vaginal bleeding, severe abdominal pain/cramping, etc.   Emergency Department Resource Guide 1) Find a Doctor and Pay Out of Pocket Although you won't have to find out who is covered by your insurance plan, it is a good idea to ask around and get recommendations. You will then need to call the office and see if the doctor you have chosen will accept you as a new patient and what types of options they offer for patients who are self-pay. Some doctors offer discounts or will set up payment plans for their patients who do not have insurance, but you will need to ask so you aren't surprised when you get to your appointment.  2) Contact Your Local Health Department Not all health departments have doctors that can see patients for sick visits, but many do, so it is worth a call to see if yours does. If you don't know where your local health department is, you can check in your phone book. The CDC also has a tool to help you locate your state's health department, and many state websites also have listings of all of their local health departments.  3) Find a Walk-in Clinic If your illness is not likely to be very severe or complicated, you may want to try a walk in clinic. These are popping up all over the country in pharmacies, drugstores, and shopping centers. They're usually staffed by nurse practitioners or physician assistants that have been trained to treat common illnesses and complaints. They're usually fairly quick and inexpensive. However, if you have serious medical issues or chronic medical problems, these are probably not your best option.  No Primary Care Doctor: - Call Health Connect at  [phone] - they can help you locate a primary care doctor that  accepts your insurance,  provides certain services, etc. - Physician Referral Service- [phone]  Chronic Pain Problems: Organization         Address  Phone   Notes  [facility]  [phone] Patients need to be referred by their primary care doctor.   Medication Assistance: Organization         Address  Phone   Notes  [facility] Medication [facility] [address] [place], [place] [address] [phone] --Must be a resident of [facility] -- Must have NO insurance coverage whatsoever (no Medicaid/ Medicare, etc.) -- The pt. MUST have a primary care doctor that directs their care regularly and follows them in the community   Med[facility]ist  [phone]   [employer]  [phone]    Agencies that provide inexpensive medical care: Organization         Address  Phone   Notes  [facility]  [phone]   [facility]    [phone]   [facility] [address] [place], [place] [address] [phone]   [facility] [address] [place] [place] [phone]   Planned Parenthood    [phone]   [facility]    [phone]   [facility]  [address], [place] Phone:  [phone], Fax:  [phone] Hours of Operation:  9 am - 6 pm, M-F.  Also accepts Medicaid/Medicare and  self-pay.  [facility]  [address], [place] Phone: [phone], Fax: [phone]. Hours of Operation:  8:30 am - 5:30 pm, M-F.  Also accepts Medicaid and self-pay.  [facility] [place] [address], [place] Phone: [phone](336) [phone]   [facility] [address], [place], [place] [phone], Ext. [phone] Mondays & Thursdays: 7-9 AM.  First 15 patients are seen on a first come, first serve basis.    Medicaid-accepting [facility] Providers:  Organization         Address  Phone    Notes  [facility] [address], [place] [phone] Also accepts self-pay patients.  [facility] [address], [place]  [phone]) [phone]   [facility] [address], [place] [phone]) [phone]   [facility] [address] [place] [address], [place] [phone]) [phone]   Renaye RakersVeita Bland [address], [place]   [phone] Only accepts [place] Access [place] patients after they have their name applied to their card.   Self-Pay (no insurance) in [facility]:  Organization         Address  Phone   Notes  Sickle Cell Patients, [facility] [address], [place] [phone](336) [phone]   [facility] [address], [place] [phone]) [phone]   [facility]  [address], [place] [phone]   [facility]/Dr. Osei-Bonsu  [address] [place] [address], [place] or [phone], [place] [phone]) [phone] Phone number for both [place] and [place] locations is the same.  [facility] [address], [place] [phone]) [phone]   [facility] [place] [address] [place] [address], [place] or [address] [phone](336) [phone]) [phone]   [facility] [address], [place] [phone](336) [phone], phone; [phone]) [phone], fax Sees patients 1st and 3rd Saturday of every month.  Must not qualify for public or private insurance (i.e. Medicaid, Medicare, [place] Health Choice, Veterans' Benefits)  Household income should be no more than 200% of the poverty level The clinic cannot treat you if you are pregnant or think you are pregnant  Sexually transmitted diseases are not treated at the clinic.    Dental Care: Organization         Address  Phone  Notes  [facility] of [facility] [address], [place] [phone])  [phone] Accepts children up to age 10821 who are enrolled in [place] or [place] Health Choice; pregnant women with a Medicaid card; and children who have applied for Medicaid or [place] Health Choice, but were declined, whose parents can pay a reduced fee at time of service.  [facility] of [facility] [place]  [address], [place] [phone]) [phone] Accepts children up to age 26 who are enrolled in [place] or [place] Health Choice; pregnant women with a Medicaid card; and children who have applied for Medicaid or [place] Health Choice, but were declined, whose parents can pay a reduced fee at time of service.  [facility]  [address], [place] [phone] Patients are seen by appointment only. Walk-ins are not accepted. [facility] will see patients 26 years of age and older. Monday - Tuesday (8am-5pm) Most Wednesdays (8:30-5pm) $  30 per visit, cash only  [facility] Adult [employer] PROGRAM  [address], [facility] [phone] Patients are seen by appointment only. Walk-ins are not accepted. [facility] will see patients 65 years of age and older. One Wednesday Evening (Monthly: Volunteer Based).  $30 per visit, cash only  [employer]  [phone] for adults; Children under age 79, call Graduate Pediatric Dentistry at [phone]. Children aged 53-14, please call [phone] to request a pediatric application.  Dental services are provided in all areas of dental care including fillings, crowns and bridges, complete and partial dentures, implants, gum treatment, root canals, and extractions. Preventive care is also provided. Treatment is provided to both adults and children. Patients are selected via a lottery and there is often a waiting list.   [facility] [address] [place]  [phone] [email or web address]   [facility] [address] [place], [place] [phone], Ext.  [phone] Second and Fourth Thursday of each month, opens at 6:30 AM; Clinic ends at 9 AM.  Patients are seen on a first-come first-served basis, and a limited number are seen during each clinic.   [facility]  [address] Ether Griffins [place], [place] [phone]   Eligibility Requirements You must have lived in [place], [place], or [place] counties for at least the last three months.   You cannot be eligible for state or federal sponsored [place], including [employer], [place], or [employer].   You generally cannot be eligible for healthcare insurance through your employer.    How to apply: Eligibility screenings are held every Tuesday and Wednesday afternoon from 1:00 pm until 4:00 pm. You do not need an appointment for the interview!  [facility] [address], [place], [place] [phone]   [facility] Health Department  [phone]   [facility] Health Department  [phone]   [facility] Health Department  [phone]    Behavioral Health Resources in the Community: Intensive Outpatient Programs Organization         Address  Phone  Notes  [facility] [address], [place], [place] [phone]   [facility]patient [address], [place], [place] [phone]   ADS: Alcohol & Drug Svcs [address], [place], [place]  [phone]   [facility] Mental Health [address]  [place], [place] [phone] or [phone]   Substance Abuse Resources Organization         Address  Phone  Notes  Alcohol and Drug Services  [phone]   [facility]  [phone]   [facility] [place]  [phone]   Floydene Flock  [phone]   Residential & Outpatient Substance Abuse Program  [phone]   Psychological Services Organization         Address  Phone  Notes  [facility] Behavioral Health  [phone]   [facility]  [phone]   [facility] [place]  Mental Health [address] [place] [phone] or [phone]    Mobile Crisis Teams Organization         Address  Phone  Notes  [facility], Mobile Crisis Care Unit  [phone]   [facility]  [address] [place], [place] [phone]   Doristine Locks [address] [place] [phone]    Self-Help/Support Groups Organization         Address  Phone  Notes  [facility]ental Health [facility]oc[facility] of [place] - variety of support groups  [place] Call for more information  Narcotics Anonymous (NA), Caring [facility] [address], [employer] [place]  2 meetings at this location   Special educational needs teacher         Address  Phone  Notes  [facility] Residential Treatment [place],    [place]  [phone]   [facility]  [address] [place] [phone], [place], [place]   [place], [place] [phone] Admissions: 8am-3pm M-F  Incentives Substance [place] [address]    [place], [place] [phone]   [facility] [address] [place], [place], [place]   [facility] [address],  [place], [place]   [facility]ight Programs - Intensive Outpatient [address], [place], [place]   [facility] ([place] [address].,  [place], [place] [phone] or [phone]   [facility] (RTS) [address], [place], [place] Accepts Medicaid  [facility] [place] [address]  [place] [phone] Substance Abuse/Addiction Treatment   [facility] Organization         Address  Phone  Notes  [facility]  [phone]   Domenic Schwab, PhD [address]lis Porta [place], [place]   [phone] or [phone]   [place]  [place], [place] [phone]   [facility] [address] [place], [place] [phone] Insurance/Medicaid/sponsorship through [facility] and Families [address]                                    [place], [place] [phone] [place] [address], [place] [phone]    Dr. Adele Schilder  [phone]   [facility] Dept. 1) [address] [place] 2) [place] 3)  [address], [place] [phone]  [phone]   [place] [phone] or [phone] (After Hours)

## 2014-02-16 NOTE — ED Notes (Signed)
Patient states she has "a sinus headache and I've been dizzy".   Patient states she has not had a period since March and she took a home pregnancy but was too faint to read.   Patient denies N/V/D.

## 2014-02-17 LAB — GC/CHLAMYDIA PROBE AMP
CT PROBE, AMP APTIMA: NEGATIVE
GC PROBE AMP APTIMA: NEGATIVE

## 2014-02-17 NOTE — ED Provider Notes (Signed)
Medical screening examination/treatment/procedure(s) were performed by non-physician practitioner and as supervising physician I was immediately available for consultation/collaboration.   Nelia Shi, MD 02/17/14 [phone]

## 2014-02-28 ENCOUNTER — Emergency Department (HOSPITAL_COMMUNITY): Payer: Medicaid Other

## 2014-02-28 ENCOUNTER — Encounter (HOSPITAL_COMMUNITY): Payer: Self-pay | Admitting: Emergency Medicine

## 2014-02-28 ENCOUNTER — Emergency Department (HOSPITAL_COMMUNITY)
Admission: EM | Admit: 2014-02-28 | Discharge: 2014-02-28 | Disposition: A | Discharge status: Home / Self Care | Payer: Medicaid Other | Attending: Emergency Medicine | Admitting: Emergency Medicine

## 2014-02-28 DIAGNOSIS — O99330 Smoking (tobacco) complicating pregnancy, unspecified trimester: Secondary | ICD-10-CM | POA: Insufficient documentation

## 2014-02-28 DIAGNOSIS — O039 Complete or unspecified spontaneous abortion without complication: Secondary | ICD-10-CM | POA: Insufficient documentation

## 2014-02-28 DIAGNOSIS — Z8619 Personal history of other infectious and parasitic diseases: Secondary | ICD-10-CM | POA: Insufficient documentation

## 2014-02-28 LAB — CBC
HEMATOCRIT: 35.7 % — AB (ref 36.0–46.0)
Hemoglobin: 11.5 g/dL — ABNORMAL LOW (ref 12.0–15.0)
MCH: 28.3 pg (ref 26.0–34.0)
MCHC: 32.2 g/dL (ref 30.0–36.0)
MCV: 87.7 fL (ref 78.0–100.0)
PLATELETS: 279 10*3/uL (ref 150–400)
RBC: 4.07 MIL/uL (ref 3.87–5.11)
RDW: 14.4 % (ref 11.5–15.5)
WBC: 6.3 10*3/uL (ref 4.0–10.5)

## 2014-02-28 LAB — ABO/RH: ABO/RH(D): O POS

## 2014-02-28 LAB — HCG, QUANTITATIVE, PREGNANCY: hCG, Beta Chain, Quant, S: 2547 m[IU]/mL — ABNORMAL HIGH (ref <5)

## 2014-02-28 LAB — POC URINE PREG, ED: Preg Test, Ur: POSITIVE — AB

## 2014-02-28 NOTE — ED Provider Notes (Signed)
CSN: [id number]     Arrival date & time 02/28/14  1359 History   First MD Initiated Contact with Patient 02/28/14 1413     Chief Complaint  Patient presents with  . Vaginal Bleeding     (Consider location/radiation/quality/duration/timing/severity/associated sxs/prior Treatment) Patient is a 28 y.o. female presenting with vaginal bleeding. The history is provided by the patient (the pt complains of vaginal bleeding.  the bleeding has stopped now,  she has no pain now. she is pregnant).  Vaginal Bleeding Quality:  Bright red Severity:  Moderate Onset quality:  Sudden Timing:  Unable to specify Progression:  Improving Chronicity:  New Menstrual history:  Regular Possible pregnancy: yes   Associated symptoms: no abdominal pain, no back pain and no fatigue     Past Medical History  Diagnosis Date  . Chlamydia   . Medical history non-contributory    Past Surgical History  Procedure Laterality Date  . No past surgeries     Family History  Problem Relation Age of Onset  . Hypertension Maternal Grandfather   . Diabetes Maternal Grandfather   . Hypertension Mother    History  Substance Use Topics  . Smoking status: Current Every Day Smoker -- 0.50 packs/day for 10 years    Types: Cigarettes  . Smokeless tobacco: Never Used  . Alcohol Use: Yes     Comment: occasional   OB History   Grav Para Term Preterm Abortions TAB SAB Ect Mult Living   3 3 3  0 0 0 0 0 0 3     Review of Systems  Constitutional: Negative for appetite change and fatigue.  HENT: Negative for congestion, ear discharge and sinus pressure.   Eyes: Negative for discharge.  Respiratory: Negative for cough.   Cardiovascular: Negative for chest pain.  Gastrointestinal: Negative for abdominal pain and diarrhea.  Genitourinary: Positive for vaginal bleeding. Negative for frequency and hematuria.  Musculoskeletal: Negative for back pain.  Skin: Negative for rash.  Neurological: Negative for seizures and  headaches.  Psychiatric/Behavioral: Negative for hallucinations.      Allergies  Review of patient's allergies indicates no known allergies.  Home Medications   Prior to Admission medications   Not on File   BP 120/70  Pulse 91  Temp(Src) 98.3 F (36.8 C) (Oral)  Resp 25  SpO2 100%  LMP 11/26/2012 Physical Exam  Constitutional: She is oriented to person, place, and time. She appears well-developed.  HENT:  Head: Normocephalic.  Eyes: Conjunctivae and EOM are normal. No scleral icterus.  Neck: Neck supple. No thyromegaly present.  Cardiovascular: Normal rate and regular rhythm.  Exam reveals no gallop and no friction rub.   No murmur heard. Pulmonary/Chest: No stridor. She has no wheezes. She has no rales. She exhibits no tenderness.  Abdominal: She exhibits no distension. There is no tenderness. There is no rebound.  Musculoskeletal: Normal range of motion. She exhibits no edema.  Lymphadenopathy:    She has no cervical adenopathy.  Neurological: She is oriented to person, place, and time. She exhibits normal muscle tone. Coordination normal.  Skin: No rash noted. No erythema.  Psychiatric: She has a normal mood and affect. Her behavior is normal.    ED Course  Procedures (including critical care time) Labs Review Labs Reviewed  CBC - Abnormal; Notable for the following:    Hemoglobin 11.5 (*)    HCT 35.7 (*)    All other components within normal limits  POC URINE PREG, ED - Abnormal; Notable for the following:  Preg Test, Ur POSITIVE (*)    All other components within normal limits  HCG, QUANTITATIVE, PREGNANCY  ABO/RH    Imaging Review [place] Ob Comp Less 14 Wks  02/28/2014   CLINICAL DATA:  Pelvic pain and bleeding  EXAM: OBSTETRIC <14 WK US AND TRANSVAGINAL OB US  TECHNIQUE: Both transabdominal and transvaginal ultrasound examinations were performed for complete evaluation of the gestation as well as the maternal uterus, adnexal regions, and pelvic cul-de-sac.  Transvaginal technique was performed to assess early pregnancy.  COMPARISON:  02/16/2014  FINDINGS: Intrauterine gestational sac: The previously seen gestational sac is no longer identified.  Yolk sac:  None  Embryo:  None  Cardiac Activity: None  Maternal uterus/adnexa: The uterus measures 9.2 x 4.5 x 5.6 cm. The endometrium measures 6 mm in thickness.  The right ovary measures 4.4 x 1.3 x 1.5 cm. The left ovary measures 2.7 x 1.5 x 3.4 cm. No acute ovarian abnormality is seen.  IMPRESSION: There is been interval loss of the gestational sac when compared with the prior exam. This is consistent with a spontaneous abortion.  The maternal pelvic organs are within normal limits.   Electronically Signed   By: Alcide CleverMark  Lukens M.D.   On: 02/28/2014 15:44   [place] Ob Transvaginal  02/28/2014   CLINICAL DATA:  Pelvic pain and bleeding  EXAM: OBSTETRIC <14 WK US AND TRANSVAGINAL OB US  TECHNIQUE: Both transabdominal and transvaginal ultrasound examinations were performed for complete evaluation of the gestation as well as the maternal uterus, adnexal regions, and pelvic cul-de-sac. Transvaginal technique was performed to assess early pregnancy.  COMPARISON:  02/16/2014  FINDINGS: Intrauterine gestational sac: The previously seen gestational sac is no longer identified.  Yolk sac:  None  Embryo:  None  Cardiac Activity: None  Maternal uterus/adnexa: The uterus measures 9.2 x 4.5 x 5.6 cm. The endometrium measures 6 mm in thickness.  The right ovary measures 4.4 x 1.3 x 1.5 cm. The left ovary measures 2.7 x 1.5 x 3.4 cm. No acute ovarian abnormality is seen.  IMPRESSION: There is been interval loss of the gestational sac when compared with the prior exam. This is consistent with a spontaneous abortion.  The maternal pelvic organs are within normal limits.   Electronically Signed   By: Alcide CleverMark  Lukens M.D.   On: 02/28/2014 15:44     EKG Interpretation None      MDM   Final diagnoses:  Miscarriage   [facility]   Benny LennertJoseph L Dyamond Tolosa, MD 02/28/14 1652

## 2014-02-28 NOTE — ED Notes (Signed)
Pt c/o vaginal spotting since yesterday then bleeding this am. She states she passed blood clots earlier this morning. She is worried that she is having a miscarriage, she just had an ultrasound last week and was [redacted] weeks pregnant. She denies pain. She is A&Ox4.

## 2014-02-28 NOTE — Discharge Instructions (Signed)
[facility]

## 2014-02-28 NOTE — ED Notes (Signed)
Pt brought back to room with family in tow; pt getting undressed and into a gown at this time; 2 green chuks placed on stretcher; Chastity, NT aware

## 2014-06-30 ENCOUNTER — Emergency Department (HOSPITAL_COMMUNITY): Payer: Medicaid Other

## 2014-06-30 ENCOUNTER — Encounter (HOSPITAL_COMMUNITY): Payer: Self-pay | Admitting: Emergency Medicine

## 2014-06-30 ENCOUNTER — Emergency Department (HOSPITAL_COMMUNITY)
Admission: EM | Admit: 2014-06-30 | Discharge: 2014-06-30 | Disposition: A | Discharge status: Home / Self Care | Payer: Medicaid Other | Attending: Emergency Medicine | Admitting: Emergency Medicine

## 2014-06-30 DIAGNOSIS — S43015A Anterior dislocation of left humerus, initial encounter: Secondary | ICD-10-CM | POA: Diagnosis not present

## 2014-06-30 DIAGNOSIS — X58XXXA Exposure to other specified factors, initial encounter: Secondary | ICD-10-CM | POA: Insufficient documentation

## 2014-06-30 DIAGNOSIS — Y9389 Activity, other specified: Secondary | ICD-10-CM | POA: Diagnosis not present

## 2014-06-30 DIAGNOSIS — Z8619 Personal history of other infectious and parasitic diseases: Secondary | ICD-10-CM | POA: Diagnosis not present

## 2014-06-30 DIAGNOSIS — Y9289 Other specified places as the place of occurrence of the external cause: Secondary | ICD-10-CM | POA: Diagnosis not present

## 2014-06-30 DIAGNOSIS — S4992XA Unspecified injury of left shoulder and upper arm, initial encounter: Secondary | ICD-10-CM | POA: Diagnosis present

## 2014-06-30 DIAGNOSIS — Z3202 Encounter for pregnancy test, result negative: Secondary | ICD-10-CM | POA: Insufficient documentation

## 2014-06-30 DIAGNOSIS — S43005A Unspecified dislocation of left shoulder joint, initial encounter: Secondary | ICD-10-CM

## 2014-06-30 DIAGNOSIS — Z720 Tobacco use: Secondary | ICD-10-CM | POA: Insufficient documentation

## 2014-06-30 LAB — POC URINE PREG, ED: Preg Test, Ur: NEGATIVE

## 2014-06-30 MED ORDER — PROPOFOL 10 MG/ML IV BOLUS
0.5000 mg/kg | Freq: Once | INTRAVENOUS | Status: DC
  Filled 2014-06-30: qty 1

## 2014-06-30 MED ORDER — SODIUM CHLORIDE 0.9 % IV SOLN
INTRAVENOUS | Status: DC
  Administered 2014-06-30: 12:00:00 via INTRAVENOUS

## 2014-06-30 MED ORDER — KETAMINE HCL 10 MG/ML IJ SOLN
INTRAMUSCULAR | Status: AC | PRN
  Administered 2014-06-30: 25 mg via INTRAVENOUS

## 2014-06-30 MED ORDER — ONDANSETRON HCL 4 MG/2ML IJ SOLN
4.0000 mg | Freq: Once | INTRAMUSCULAR | Status: AC
  Administered 2014-06-30: 4 mg via INTRAVENOUS
  Filled 2014-06-30: qty 2

## 2014-06-30 MED ORDER — HYDROCODONE-ACETAMINOPHEN 5-325 MG PO TABS: 1.0000 | ORAL_TABLET | ORAL | Status: DC | PRN

## 2014-06-30 MED ORDER — KETAMINE HCL 10 MG/ML IJ SOLN
INTRAMUSCULAR | Status: AC | PRN
  Administered 2014-06-30: 12.5 mg via INTRAVENOUS

## 2014-06-30 MED ORDER — PROPOFOL 10 MG/ML IV BOLUS
INTRAVENOUS | Status: AC | PRN
  Administered 2014-06-30: 25 mg via INTRAVENOUS

## 2014-06-30 MED ORDER — PROPOFOL 10 MG/ML IV BOLUS
INTRAVENOUS | Status: AC | PRN
  Administered 2014-06-30: 12.5 mg via INTRAVENOUS

## 2014-06-30 MED ORDER — FENTANYL CITRATE 0.05 MG/ML IJ SOLN
100.0000 ug | INTRAMUSCULAR | Status: DC | PRN
  Administered 2014-06-30 (×2): 100 ug via INTRAVENOUS
  Filled 2014-06-30 (×2): qty 2

## 2014-06-30 MED ORDER — KETAMINE HCL 10 MG/ML IJ SOLN
0.5000 mg/kg | Freq: Once | INTRAMUSCULAR | Status: DC
  Filled 2014-06-30: qty 3

## 2014-06-30 NOTE — ED Provider Notes (Signed)
Medical screening examination/treatment/procedure(s) were conducted as a shared visit with non-physician practitioner(s) and myself.  I personally evaluated the patient during the encounter.   EKG Interpretation None       Flint MelterElliott L Brigitta Pricer, MD 06/30/14 1527

## 2014-06-30 NOTE — ED Provider Notes (Signed)
Reduction of dislocation Date/Time: 06/30/2014/12:30PM Performed by: Carolee RotaGEIPLE,Daleiza Bacchi S Authorized by: Jenita SeashoreGEIPLE,[place] Ganoe S, Elliot Wentz MD Consent: Verbal consent obtained. Risks and benefits: risks, benefits and alternatives were discussed Consent given by: patient Required items: required blood products, implants, devices, and special equipment available Time out: Immediately prior to procedure a "time out" was called to verify the correct patient, procedure, equipment, support staff and site/side marked as required.  Patient sedated: yes, see conscious sedation note  Vitals: Vital signs were monitored during sedation. Patient tolerance: Patient tolerated the procedure well with no immediate complications. Joint: L shoulder Reduction technique: using adduction, external rotation, flexion  Reduction successful.     Joanna CriglerJoshua Styles Fambro, PA-C 06/30/14 1316

## 2014-06-30 NOTE — ED Notes (Signed)
Bed: WA07 Expected date:  Expected time:  Means of arrival:  Comments: 

## 2014-06-30 NOTE — ED Notes (Signed)
Pt will be waiting for a ride and/or taking the bus, bus pas given. Pt came by EMS but still made aware that she cannot drive for at least 6 hours.

## 2014-06-30 NOTE — Progress Notes (Signed)
[facility] Community Liaison Stacy, ° °Provided pt with a list of primary care resources to help patient establish a pcp.  °

## 2014-06-30 NOTE — Discharge Instructions (Signed)
Use ice on the sore area 3 times a day, for 2 days, after that, use heat. Call the orthopedic doctor for a followup appointment, next week. Wear the shoulder immobilizer, except for changing, and bathing until you see the orthopedic doctor.    Shoulder Dislocation Your shoulder is made up of three bones: the collar bone (clavicle); the shoulder blade (scapula), which includes the socket (glenoid cavity); and the upper arm bone (humerus). Your shoulder joint is the place where these bones meet. Strong, fibrous tissues hold these bones together (ligaments). Muscles and strong, fibrous tissues that connect the muscles to these bones (tendons) allow your arm to move through this joint. The range of motion of your shoulder joint is more extensive than most of your other joints, and the glenoid cavity is very shallow. That is the reason that your shoulder joint is one of the most unstable joints in your body. It is far more prone to dislocation than your other joints. Shoulder dislocation is when your humerus is forced out of your shoulder joint. CAUSES Shoulder dislocation is caused by a forceful impact on your shoulder. This impact usually is from an injury, such as a sports injury or a fall. SYMPTOMS Symptoms of shoulder dislocation include:  Deformity of your shoulder.  Intense pain.  Inability to move your shoulder joint.  Numbness, weakness, or tingling around your shoulder joint (your neck or down your arm).  Bruising or swelling around your shoulder. DIAGNOSIS In order to diagnose a dislocated shoulder, your caregiver will perform a physical exam. Your caregiver also may have an X-ray exam done to see if you have any broken bones. Magnetic resonance imaging (MRI) is a procedure that sometimes is done to help your caregiver see any damage to the soft tissues around your shoulder, particularly your rotator cuff tendons. Additionally, your caregiver also may have electromyography done to  measure the electrical discharges produced in your muscles if you have signs or symptoms of nerve damage. TREATMENT A shoulder dislocation is treated by placing the humerus back in the joint (reduction). Your caregiver does this either manually (closed reduction), by moving your humerus back into the joint through manipulation, or through surgery (open reduction). When your humerus is back in place, severe pain should improve almost immediately. You also may need to have surgery if you have a weak shoulder joint or ligaments, and you have recurring shoulder dislocations, despite rehabilitation. In rare cases, surgery is necessary if your nerves or blood vessels are damaged during the dislocation. After your reduction, your arm will be placed in a shoulder immobilizer or sling to keep it from moving. Your caregiver will have you wear your shoulder immobilizer or sling for 3 days to 3 weeks, depending on how serious your dislocation is. When your shoulder immobilizer or sling is removed, your caregiver may prescribe physical therapy to help improve the range of motion in your shoulder joint. HOME CARE INSTRUCTIONS  The following measures can help to reduce pain and speed up the healing process:  Rest your injured joint. Do not move it. Avoid activities similar to the one that caused your injury.  Apply ice to your injured joint for the first day or two after your reduction or as directed by your caregiver. Applying ice helps to reduce inflammation and pain.  Put ice in a plastic bag.  Place a towel between your skin and the bag.  Leave the ice on for 15-20 minutes at a time, every 2 hours while you are  awake.  Exercise your hand by squeezing a soft ball. This helps to eliminate stiffness and swelling in your hand and wrist.  Take over-the-counter or prescription medicine for pain or discomfort as told by your caregiver. SEEK IMMEDIATE MEDICAL CARE IF:   Your shoulder immobilizer or sling becomes  damaged.  Your pain becomes worse rather than better.  You lose feeling in your arm or hand, or they become white and cold. MAKE SURE YOU:   Understand these instructions.  Will watch your condition.  Will get help right away if you are not doing well or get worse. Document Released: 06/03/2001 Document Revised: 01/23/2014 Document Reviewed: 06/29/2011 [facility] Patient Information 2015 [employer] [place]. This information is not intended to replace advice given to you by your health care provider. Make sure you discuss any questions you have with your health care provider.
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
## 2014-07-24 ENCOUNTER — Encounter (HOSPITAL_COMMUNITY): Payer: Self-pay | Admitting: Emergency Medicine

## 2014-11-19 ENCOUNTER — Encounter (HOSPITAL_COMMUNITY): Payer: Self-pay | Admitting: Emergency Medicine

## 2014-11-19 ENCOUNTER — Emergency Department (HOSPITAL_COMMUNITY)
Admission: EM | Admit: 2014-11-19 | Discharge: 2014-11-19 | Discharge status: Against Medical Advice | Payer: Medicaid Other | Attending: Emergency Medicine | Admitting: Emergency Medicine

## 2014-11-19 DIAGNOSIS — Z720 Tobacco use: Secondary | ICD-10-CM | POA: Insufficient documentation

## 2014-11-19 DIAGNOSIS — M545 Low back pain: Secondary | ICD-10-CM | POA: Insufficient documentation

## 2014-11-19 DIAGNOSIS — N898 Other specified noninflammatory disorders of vagina: Secondary | ICD-10-CM | POA: Diagnosis not present

## 2014-11-19 LAB — URINALYSIS, ROUTINE W REFLEX MICROSCOPIC
Bilirubin Urine: NEGATIVE
Glucose, UA: NEGATIVE mg/dL
KETONES UR: NEGATIVE mg/dL
Nitrite: POSITIVE — AB
Protein, ur: NEGATIVE mg/dL
Specific Gravity, Urine: 1.015 (ref 1.005–1.030)
Urobilinogen, UA: 0.2 mg/dL (ref 0.0–1.0)
pH: 5.5 (ref 5.0–8.0)

## 2014-11-19 LAB — URINE MICROSCOPIC-ADD ON

## 2014-11-19 LAB — POC URINE PREG, ED: Preg Test, Ur: NEGATIVE

## 2014-11-19 NOTE — ED Notes (Signed)
Pt c/o lower back pain worse with inspiration; pt requesting pregnancy test and sts LMP was middle of July; pt sts some vaginal discharge

## 2014-11-19 NOTE — ED Notes (Signed)
The pt had to leave because of child care issues. Encouraged pt to stay for medical screening but she states she can not stay any longer and she will return later

## 2014-11-20 ENCOUNTER — Encounter (HOSPITAL_COMMUNITY): Payer: Self-pay | Admitting: Emergency Medicine

## 2014-11-20 ENCOUNTER — Emergency Department (HOSPITAL_COMMUNITY): Payer: Medicaid Other

## 2014-11-20 ENCOUNTER — Emergency Department (HOSPITAL_COMMUNITY)
Admission: EM | Admit: 2014-11-20 | Discharge: 2014-11-21 | Disposition: A | Discharge status: Home / Self Care | Payer: Medicaid Other | Attending: Emergency Medicine | Admitting: Emergency Medicine

## 2014-11-20 DIAGNOSIS — B3731 Acute candidiasis of vulva and vagina: Secondary | ICD-10-CM

## 2014-11-20 DIAGNOSIS — Z202 Contact with and (suspected) exposure to infections with a predominantly sexual mode of transmission: Secondary | ICD-10-CM | POA: Diagnosis present

## 2014-11-20 DIAGNOSIS — R05 Cough: Secondary | ICD-10-CM | POA: Diagnosis not present

## 2014-11-20 DIAGNOSIS — R059 Cough, unspecified: Secondary | ICD-10-CM

## 2014-11-20 DIAGNOSIS — Z720 Tobacco use: Secondary | ICD-10-CM | POA: Diagnosis not present

## 2014-11-20 DIAGNOSIS — B373 Candidiasis of vulva and vagina: Secondary | ICD-10-CM | POA: Diagnosis not present

## 2014-11-20 DIAGNOSIS — Z3202 Encounter for pregnancy test, result negative: Secondary | ICD-10-CM | POA: Diagnosis not present

## 2014-11-20 DIAGNOSIS — N12 Tubulo-interstitial nephritis, not specified as acute or chronic: Secondary | ICD-10-CM | POA: Diagnosis not present

## 2014-11-20 LAB — COMPREHENSIVE METABOLIC PANEL
ALT: 12 U/L (ref 0–35)
AST: 19 U/L (ref 0–37)
Albumin: 4 g/dL (ref 3.5–5.2)
Alkaline Phosphatase: 78 U/L (ref 39–117)
Anion gap: 8 (ref 5–15)
BUN: 5 mg/dL — AB (ref 6–23)
CHLORIDE: 106 mmol/L (ref 96–112)
CO2: 22 mmol/L (ref 19–32)
CREATININE: 1.05 mg/dL (ref 0.50–1.10)
Calcium: 9 mg/dL (ref 8.4–10.5)
GFR, EST AFRICAN AMERICAN: 84 mL/min — AB (ref >90)
GFR, EST NON AFRICAN AMERICAN: 72 mL/min — AB (ref >90)
Glucose, Bld: 89 mg/dL (ref 70–99)
Potassium: 3.3 mmol/L — ABNORMAL LOW (ref 3.5–5.1)
Sodium: 136 mmol/L (ref 135–145)
Total Bilirubin: 0.5 mg/dL (ref 0.3–1.2)
Total Protein: 7.3 g/dL (ref 6.0–8.3)

## 2014-11-20 LAB — CBC WITH DIFFERENTIAL/PLATELET
BASOS PCT: 0 % (ref 0–1)
Basophils Absolute: 0 10*3/uL (ref 0.0–0.1)
EOS PCT: 0 % (ref 0–5)
Eosinophils Absolute: 0 10*3/uL (ref 0.0–0.7)
HEMATOCRIT: 39.1 % (ref 36.0–46.0)
HEMOGLOBIN: 13.2 g/dL (ref 12.0–15.0)
Lymphocytes Relative: 19 % (ref 12–46)
Lymphs Abs: 1.8 10*3/uL (ref 0.7–4.0)
MCH: 29.6 pg (ref 26.0–34.0)
MCHC: 33.8 g/dL (ref 30.0–36.0)
MCV: 87.7 fL (ref 78.0–100.0)
MONO ABS: 0.8 10*3/uL (ref 0.1–1.0)
MONOS PCT: 9 % (ref 3–12)
NEUTROS ABS: 7 10*3/uL (ref 1.7–7.7)
Neutrophils Relative %: 72 % (ref 43–77)
Platelets: 279 10*3/uL (ref 150–400)
RBC: 4.46 MIL/uL (ref 3.87–5.11)
RDW: 12.9 % (ref 11.5–15.5)
WBC: 9.7 10*3/uL (ref 4.0–10.5)

## 2014-11-20 LAB — PREGNANCY, URINE: PREG TEST UR: NEGATIVE

## 2014-11-20 NOTE — ED Notes (Signed)
Pt taken to XR.  

## 2014-11-20 NOTE — ED Notes (Signed)
The patient is here because she has had a cough and it hurts when she coughs.  She is also here to get checked for an STD because she is having a disccharge.  She was here yesterday but had to  Leave to get her son.

## 2014-11-20 NOTE — ED Provider Notes (Signed)
CSN: [id number]     Arrival date & time 11/20/14  1958 History   First MD Initiated Contact with Patient 11/20/14 2250     Chief Complaint  Patient presents with  . Pain    The patient is here because she has had a cough and it hurts when she coughs.  She is also here to get checked for an STD because she is having a disccharge.  . Exposure to STD     (Consider location/radiation/quality/duration/timing/severity/associated sxs/prior Treatment) Patient is a 29 y.o. female presenting with STD exposure. The history is provided by the patient and medical records. No language interpreter was used.  Exposure to STD Associated symptoms include coughing, nausea and vomiting. Pertinent negatives include no abdominal pain, chest pain, diaphoresis, fatigue, fever, headaches or rash.     Joanna Melton is a 29 y.o. female  with a hx of no major medical problems presents to the Emergency Department complaining of gradual, persistent, progressively worsening right flank pain onset 2-3 days ago. Associated symptoms include subjective fever and chills, dry cough, emesis x1, vaginal discharge, urinary frequency.  Emesis is NBNB.  Pt reports sexually active with 1 female partner without condom usage.  Pt denies sick contacts.  Pt smokes 0.5 ppd for 10 years.  Pt reports concern for STD and pregnancy.   Nothing makes it better and nothing makes it worse.  Pt denies headache, neck pain, chest pain, shortness of breath, abdominal pain or diarrhea, weakness, dizziness, syncope.  LMP: 6 weeks ago.   Past Medical History  Diagnosis Date  . Chlamydia   . Medical history non-contributory    Past Surgical History  Procedure Laterality Date  . No past surgeries     Family History  Problem Relation Age of Onset  . Hypertension Maternal Grandfather   . Diabetes Maternal Grandfather   . Hypertension Mother    History  Substance Use Topics  . Smoking status: Current Every Day Smoker -- 0.50 packs/day for 10  years    Types: Cigarettes  . Smokeless tobacco: Never Used  . Alcohol Use: Yes     Comment: occasional   OB History    Gravida Para Term Preterm AB TAB SAB Ectopic Multiple Living   0 0 0 0 0 0 3     Review of Systems  Constitutional: Negative for fever, diaphoresis, appetite change, fatigue and unexpected weight change.  HENT: Negative for mouth sores.   Eyes: Negative for visual disturbance.  Respiratory: Positive for cough. Negative for chest tightness, shortness of breath and wheezing.   Cardiovascular: Negative for chest pain.  Gastrointestinal: Positive for nausea and vomiting. Negative for abdominal pain, diarrhea and constipation.  Endocrine: Negative for polydipsia, polyphagia and polyuria.  Genitourinary: Positive for flank pain and vaginal discharge. Negative for dysuria, urgency, frequency and hematuria.  Musculoskeletal: Negative for back pain and neck stiffness.  Skin: Negative for rash.  Allergic/Immunologic: Negative for immunocompromised state.  Neurological: Negative for syncope, light-headedness and headaches.  Hematological: Does not bruise/bleed easily.  Psychiatric/Behavioral: Negative for sleep disturbance. The patient is not nervous/anxious.       Allergies  Review of patient's allergies indicates no known allergies.  Home Medications   Prior to Admission medications   Medication Sig Start Date End Date Taking? Authorizing Provider  cephALEXin (KEFLEX) 500 MG capsule Take 1 capsule (500 mg total) by mouth 4 (four) times daily. 11/21/14   Fiza Nation, PA-C  HYDROcodone-acetaminophen (NORCO/VICODIN) 5-325 MG per tablet Take  1-2 tablets by mouth every 6 (six) hours as needed for moderate pain or severe pain. 11/21/14   Anye Brose, PA-C   BP 104/49 mmHg  Pulse 78  Temp(Src) 101 F (38.3 C) (Rectal)  Resp 22  Ht 5\' 2"  (1.575 m)  Wt 127 lb (57.607 kg)  BMI 23.22 kg/m2  SpO2 97%  LMP 10/04/2014 Physical Exam  Constitutional: She  appears well-developed and well-nourished. No distress.  Awake, alert, nontoxic appearance  HENT:  Head: Normocephalic and atraumatic.  Mouth/Throat: Oropharynx is clear and moist. No oropharyngeal exudate.  Eyes: Conjunctivae are normal. No scleral icterus.  Neck: Normal range of motion. Neck supple.  Cardiovascular: Normal rate, regular rhythm, normal heart sounds and intact distal pulses.   No murmur heard. Pulmonary/Chest: Effort normal and breath sounds normal. No respiratory distress. She has no wheezes.  Equal chest expansion  Abdominal: Soft. Bowel sounds are normal. She exhibits no distension and no mass. There is no tenderness. There is CVA tenderness (Right). There is no rebound and no guarding. Hernia confirmed negative in the right inguinal area and confirmed negative in the left inguinal area.  Abdomen soft and nontender  Genitourinary: Uterus normal. No labial fusion. There is no rash, tenderness or lesion on the right labia. There is no rash, tenderness or lesion on the left labia. Uterus is not deviated, not enlarged, not fixed and not tender. Cervix exhibits no motion tenderness, no discharge and no friability. Right adnexum displays no mass, no tenderness and no fullness. Left adnexum displays no mass, no tenderness and no fullness. No erythema, tenderness or bleeding in the vagina. No foreign body around the vagina. No signs of injury around the vagina. Vaginal discharge (Small, white, nonodorous) found.  Musculoskeletal: Normal range of motion. She exhibits no edema.  Lymphadenopathy:       Right: No inguinal adenopathy present.       Left: No inguinal adenopathy present.  Neurological: She is alert.  Speech is clear and goal oriented Moves extremities without ataxia  Skin: Skin is warm and dry. No rash noted. She is not diaphoretic. No erythema.  Psychiatric: She has a normal mood and affect.  Nursing note and vitals reviewed.   ED Course  Procedures (including  critical care time) Labs Review Labs Reviewed  WET PREP, GENITAL - Abnormal; Notable for the following:    Yeast Wet Prep HPF POC FEW (*)    All other components within normal limits  COMPREHENSIVE METABOLIC PANEL - Abnormal; Notable for the following:    Potassium 3.3 (*)    BUN 5 (*)    GFR calc non Af Amer 72 (*)    GFR calc Af Amer 84 (*)    All other components within normal limits  URINALYSIS, ROUTINE W REFLEX MICROSCOPIC - Abnormal; Notable for the following:    APPearance CLOUDY (*)    Hgb urine dipstick MODERATE (*)    Ketones, ur 15 (*)    Protein, ur 30 (*)    Nitrite POSITIVE (*)    Leukocytes, UA LARGE (*)    All other components within normal limits  URINE MICROSCOPIC-ADD ON - Abnormal; Notable for the following:    Bacteria, UA MANY (*)    All other components within normal limits  URINE CULTURE  CBC WITH DIFFERENTIAL/PLATELET  PREGNANCY, URINE  GC/CHLAMYDIA PROBE AMP ([place])    Imaging Review Dg Chest 2 View  11/21/2014   CLINICAL DATA:  Cough, fever, body aches  EXAM: CHEST  2  VIEW  COMPARISON:  None.  FINDINGS: Normal heart size and mediastinal contours. No acute infiltrate or edema. No effusion or pneumothorax. No acute osseous findings.  IMPRESSION: Negative chest.   Electronically Signed   By: Marnee Spring M.D.   On: 11/21/2014 00:51     EKG Interpretation None      MDM   Final diagnoses:  Cough  Vaginal candidiasis  Pyelonephritis   Joanna Melton presents with concerns of STD, right flank pain, fevers at home, generalized body aches.  Concern for possible pyelonephritis. Will assess for STDs.  12:59 AM Pt UA with evidence of infection.  No leukocytosis and normal renal function.  Patient with fevers, CVA tenderness and infected urine flow consistent with pyelonephritis. Patient is without intractable vomiting at this time. Will give pain control, antibiotics and reassess.  Pelvic exam consistent with decent infection and yeast seen  on wet prep. Will treat here in the emergency department.  1:48 AM Pt pain improved but not resolved.  She is tolerating PO without difficulty.  Will d/c home with keflex and pain control.  Urine culture pending.    I have personally reviewed patient's vitals, nursing note and any pertinent labs or imaging.  I performed an undressed physical exam.    It has been determined that no acute conditions requiring further emergency intervention are present at this time. The patient/guardian have been advised of the diagnosis and plan. I reviewed all labs and imaging including any potential incidental findings. We have discussed signs and symptoms that warrant return to the ED and they are listed in the discharge instructions.    Vital signs are stable at discharge.   BP 104/49 mmHg  Pulse 78  Temp(Src) 101 F (38.3 C) (Rectal)  Resp 22  Ht  (1.575 m)  Wt 127 lb (57.607 kg)  BMI 23.22 kg/m2  SpO2 97%  LMP 10/04/2014        Joanna Client Aleatha Taite, PA-C 11/21/14 0148  Ethelda Chick, MD 11/24/14 1030

## 2014-11-20 NOTE — ED Notes (Signed)
Pt reports pain in lower back for past couple of days with pain in joints all over. Pt alert and oriented, wants to get checked for STDs and pregnancy.  Pt denies any urinary symptoms.

## 2014-11-20 NOTE — ED Notes (Signed)
Pt made aware to return if symptoms worsen or if any life threatening symptoms occur.   

## 2014-11-21 ENCOUNTER — Telehealth: Payer: Self-pay | Admitting: *Deleted

## 2014-11-21 LAB — WET PREP, GENITAL
Clue Cells Wet Prep HPF POC: NONE SEEN
TRICH WET PREP: NONE SEEN
WBC, Wet Prep HPF POC: NONE SEEN

## 2014-11-21 LAB — URINALYSIS, ROUTINE W REFLEX MICROSCOPIC
Bilirubin Urine: NEGATIVE
Glucose, UA: NEGATIVE mg/dL
Ketones, ur: 15 mg/dL — AB
Nitrite: POSITIVE — AB
PROTEIN: 30 mg/dL — AB
SPECIFIC GRAVITY, URINE: 1.018 (ref 1.005–1.030)
Urobilinogen, UA: 1 mg/dL (ref 0.0–1.0)
pH: 6 (ref 5.0–8.0)

## 2014-11-21 LAB — URINE MICROSCOPIC-ADD ON

## 2014-11-21 MED ORDER — SODIUM CHLORIDE 0.9 % IV BOLUS (SEPSIS)
500.0000 mL | Freq: Once | INTRAVENOUS | Status: AC
  Administered 2014-11-21: 500 mL via INTRAVENOUS

## 2014-11-21 MED ORDER — FLUCONAZOLE 100 MG PO TABS
150.0000 mg | ORAL_TABLET | Freq: Once | ORAL | Status: AC
Start: 2014-11-21 — End: 2014-11-21
  Administered 2014-11-21: 150 mg via ORAL
  Filled 2014-11-21: qty 2

## 2014-11-21 MED ORDER — MORPHINE SULFATE 4 MG/ML IJ SOLN
2.0000 mg | Freq: Once | INTRAMUSCULAR | Status: AC
  Administered 2014-11-21: 2 mg via INTRAVENOUS
  Filled 2014-11-21: qty 1

## 2014-11-21 MED ORDER — MORPHINE SULFATE 4 MG/ML IJ SOLN: 2.0000 mg | Freq: Once | INTRAMUSCULAR | Status: DC

## 2014-11-21 MED ORDER — HYDROCODONE-ACETAMINOPHEN 5-325 MG PO TABS: 1.0000 | ORAL_TABLET | Freq: Four times a day (QID) | ORAL | Status: DC | PRN

## 2014-11-21 MED ORDER — CEPHALEXIN 500 MG PO CAPS: 500.0000 mg | ORAL_CAPSULE | Freq: Four times a day (QID) | ORAL | Status: DC

## 2014-11-21 MED ORDER — OXYCODONE-ACETAMINOPHEN 5-325 MG PO TABS
2.0000 | ORAL_TABLET | Freq: Once | ORAL | Status: AC
  Administered 2014-11-21: 2 via ORAL
  Filled 2014-11-21: qty 2

## 2014-11-21 MED ORDER — DEXTROSE 5 % IV SOLN
1.0000 g | Freq: Once | INTRAVENOUS | Status: AC
  Administered 2014-11-21: 1 g via INTRAVENOUS
  Filled 2014-11-21: qty 10

## 2014-11-21 NOTE — Telephone Encounter (Signed)
Pt mom called to see if we would fax prescription to pharmacy.  NCM informed that they needed to pick up another prescription, especially being that one is narcotic.  Mother verbalizes understanding.

## 2014-11-21 NOTE — Discharge Instructions (Signed)
1. Medications: Keflex, Vicodin, usual home medications 2. Treatment: rest, drink plenty of fluids, take medications as prescribed 3. Follow Up: Please followup with your primary doctor in 3 days for discussion of your diagnoses and further evaluation after today's visit; if you do not have a primary care doctor use the resource guide provided to find one; return to the ER for fevers, persistent vomiting, worsening abdominal pain or other concerning symptoms.   Pyelonephritis, Adult Pyelonephritis is a kidney infection. In general, there are 2 main types of pyelonephritis:  Infections that come on quickly without any warning (acute pyelonephritis).  Infections that persist for a long period of time (chronic pyelonephritis). CAUSES  Two main causes of pyelonephritis are:  Bacteria traveling from the bladder to the kidney. This is a problem especially in pregnant women. The urine in the bladder can become filled with bacteria from multiple causes, including:  Inflammation of the prostate gland (prostatitis).  Sexual intercourse in females.  Bladder infection (cystitis).  Bacteria traveling from the bloodstream to the tissue part of the kidney. Problems that may increase your risk of getting a kidney infection include:  Diabetes.  Kidney stones or bladder stones.  Cancer.  Catheters placed in the bladder.  Other abnormalities of the kidney or ureter. SYMPTOMS   Abdominal pain.  Pain in the side or flank area.  Fever.  Chills.  Upset stomach.  Blood in the urine (dark urine).  Frequent urination.  Strong or persistent urge to urinate.  Burning or stinging when urinating. DIAGNOSIS  Your caregiver may diagnose your kidney infection based on your symptoms. A urine sample may also be taken. TREATMENT  In general, treatment depends on how severe the infection is.   If the infection is mild and caught early, your caregiver may treat you with oral antibiotics and send  you home.  If the infection is more severe, the bacteria may have gotten into the bloodstream. This will require intravenous (IV) antibiotics and a hospital stay. Symptoms may include:  High fever.  Severe flank pain.  Shaking chills.  Even after a hospital stay, your caregiver may require you to be on oral antibiotics for a period of time.  Other treatments may be required depending upon the cause of the infection. HOME CARE INSTRUCTIONS   Take your antibiotics as directed. Finish them even if you start to feel better.  Make an appointment to have your urine checked to make sure the infection is gone.  Drink enough fluids to keep your urine clear or pale yellow.  Take medicines for the bladder if you have urgency and frequency of urination as directed by your caregiver. SEEK IMMEDIATE MEDICAL CARE IF:   You have a fever or persistent symptoms for more than 2-3 days.  You have a fever and your symptoms suddenly get worse.  You are unable to take your antibiotics or fluids.  You develop shaking chills.  You experience extreme weakness or fainting.  There is no improvement after 2 days of treatment. MAKE SURE YOU:  Understand these instructions.  Will watch your condition.  Will get help right away if you are not doing well or get worse. Document Released: 09/08/2005 Document Revised: 03/09/2012 Document Reviewed: 02/12/2011 [facility] Patient Information 2015 [place], [place]. This information is not intended to replace advice given to you by your health care provider. Make sure you discuss any questions you have with your health care provider.    Emergency Department Resource Guide 1) Find a Doctor and Pay  Out of Pocket Although you won't have to find out who is covered by your insurance plan, it is a good idea to ask around and get recommendations. You will then need to call the office and see if the doctor you have chosen will accept you as a new patient and what types  of options they offer for patients who are self-pay. Some doctors offer discounts or will set up payment plans for their patients who do not have insurance, but you will need to ask so you aren't surprised when you get to your appointment.  2) Contact Your Local Health Department Not all health departments have doctors that can see patients for sick visits, but many do, so it is worth a call to see if yours does. If you don't know where your local health department is, you can check in your phone book. The CDC also has a tool to help you locate your state's health department, and many state websites also have listings of all of their local health departments.  3) Find a Walk-in Clinic If your illness is not likely to be very severe or complicated, you may want to try a walk in clinic. These are popping up all over the country in pharmacies, drugstores, and shopping centers. They're usually staffed by nurse practitioners or physician assistants that have been trained to treat common illnesses and complaints. They're usually fairly quick and inexpensive. However, if you have serious medical issues or chronic medical problems, these are probably not your best option.  No Primary Care Doctor: - Call Health Connect at  [phone] - they can help you locate a primary care doctor that  accepts your insurance, provides certain services, etc. - Physician Referral Service- [phone]  Chronic Pain Problems: Organization         Address  Phone   Notes  [facility]  [phone] Patients need to be referred by their primary care doctor.   Medication Assistance: Organization         Address  Phone   Notes  [facility] Medication [facility] [address] [place] [place], [place] [address] [phone] --Must be a resident of [facility] -- Must have NO insurance coverage whatsoever (no Medicaid/ Medicare, etc.) -- The pt. MUST have a primary care doctor that  directs their care regularly and follows them in the community   [facility]  [phone]   [employer]  [phone]    Agencies that provide inexpensive medical care: Organization         Address  Phone   Notes  [facility]  [phone]   [facility]    [phone]   [facility] [address] [place], [place] [address] [phone]   [facility] [address] [place] [place] [phone]   [facility]anned Parenthood    [phone]   [facility]    [phone]   [facility]  [address], [place] Phone:  [phone], Fax:  [phone] Hours of Operation:  9 am - 6 pm, M-F.  Also accepts Medicaid/Medicare and self-pay.  [facility]  [address], [place] Phone: [phone], Fax: [phone]. Hours of Operation:  8:30 am - 5:30 pm, M-F.  Also accepts Medicaid and self-pay.  [facility] [place] [address] [employer] Phone: [phone]  [facility] [address], [place], [place] (336)[phone], Ext. 123 Mondays & Thursdays: 7-9 AM.  First 15 patients are seen on a first come, first serve basis. °  ° °Medicaid-accepting [place] Providers: ° °Organization         Address  Phone   Notes  °[facility] [address], [place] [phone] Also accepts self-pay patients.  °[facility] [address], [place] ° ([phone]) [phone]   °[facility] [address], [place] ([phone]) [phone]   °[facility] [address] [place] [address], [place] ([phone]) [phone]   °Veita Bland [address], [place]  ° ([phone]) [phone] Only accepts [place] Access Medicaid patients after they have their name applied to their card.  ° °Self-Pay (no insurance) in [place]: ° °Organization          Address  Phone   Notes  °Sickle Cell Patients, [facility] [address], [place] ([phone]) [phone]   °[facility] [address], [place] ([phone]) [phone]   °[facility] ° [address], [place] ([phone]) [phone]   °[facility]/Dr. Osei-Bonsu ° [address] [place] [address], [place] or [address], [place] ([phone]) [phone] Phone number for both [place] and [place] locations is the same.  °[facility] [address], [place] ([phone]) [phone]   °[facility] [address] [place] [address], [place] or [address] ([phone]) [phone] °(336) [phone]   °[facility] [address], [place] (336) [phone], phone; ([phone]) [phone], fax Sees patients 1st and 3rd Saturday of every month.  Must not qualify for public or private insurance (i.e. Medicaid, Medicare, [place] Health Choice, Veterans' Benefits) • Household income should be no more than 200% of the poverty level •The clinic cannot treat you if you are pregnant or think you are pregnant • Sexually transmitted diseases are not treated at the clinic.  ° ° °Dental Care: °Organization         Address  Phone  Notes  °[place] [facility] [address], [place] ([phone]) [phone] Accepts children up to age 21 who are enrolled in Medicaid or [place] Health Choice; pregnant women with a Medicaid card; and children who have applied for Medicaid or [place] Health Choice, but were declined, whose parents can pay a reduced fee at time of service.  °[place] [facility] [place]  [address], [place] (336) [phone] Accepts children up to age 21 who are enrolled in Medicaid or [place] Health Choice; pregnant women with a Medicaid card; and children who have applied for Medicaid or  Health Choice, but were declined, whose parents can pay a reduced fee at time of  service.  °[facility] ° [address],  ([phone]) [phone] Patients are seen by appointment only. Walk-ins are not accepted. [facility] will see patients 18 years of age and older. °Monday - Tuesday (8am-5pm) °Most Wednesdays (8:30-5pm) °$30 per visit, cash only  °[facility] ° [address], [place] ([phone]) [phone] Patients are seen by appointment only. Walk-ins are not accepted. [facility] will see patients 18 years of age and older. °One Wednesday Evening (Monthly: Volunteer Based).  $30 per visit,   cash only  °[facility]  ([phone] for adults; Children under age 4, call Graduate Pediatric Dentistry at [phone]. Children aged 4-14, please call [phone] to request a pediatric application. ° Dental services are provided in all areas of dental care including fillings, crowns and bridges, complete and partial dentures, implants, gum treatment, root canals, and extractions. Preventive care is also provided. Treatment is provided to both adults and children. °Patients are selected via a lottery and there is often a waiting list. °  °[facility] [address], °[place] ° ([phone]) 763-8833 [email or web address] °  °[facility] [address], [place], [place] (336)723-1848, Ext. 123 Second and Fourth Thursday of each month, opens at 6:30 AM; Clinic ends at 9 AM.  Patients are seen on a first-come first-served basis, and a limited number are seen during each clinic.  ° °[facility] ° [address], [place], [place] ([phone]) [phone]   Eligibility Requirements °You must have lived in [place], [place], or [place] counties for at least the last three months. °  You cannot be eligible for state or federal sponsored healthcare insurance, including Veterans Administration, Medicaid, or Medicare. °  You generally cannot be eligible for healthcare insurance through your employer.   °  How to apply: °Eligibility screenings are held every Tuesday and Wednesday afternoon from 1:00 pm until 4:00 pm. You do not need an appointment for the interview!  °[facility] [address], [place], [place] [phone]   °[facility]  336-342-8273   °[facility]  336-703-3100   °[facility]  336-570-6415   ° °Behavioral Health Resources in the Community: °Intensive Outpatient Programs °Organization         Address  Phone  Notes  °[facility] [address], [place], [place] 336-878-6098   °[facility] [address], [place], [place] [phone]   °[facility] [address], [place], [place] ° 336-882-2125   °[facility] [address],  °[place], [place] 1-800-853-5163 or 336-641-4981   °Substance Abuse Resources °Organization         Address  Phone  Notes  °[facility]  336-882-2125   °[facility]  336-784-9470   °[facility]  336-285-9073   °[facility]  336-845-3988   °Residential & Outpatient Substance Abuse Program  1-800-659-3381   °Psychological Services °Organization         Address  Phone  Notes  °[facility]  336- 832-9600   °[facility]  336- 378-7881   °[facility] [address], [place] 1-800-853-5163 or 336-641-4981   ° °Mobile Crisis Teams °Organization         Address  Phone  Notes  °[facility], Mobile Crisis Care Unit  1-877-626-1772   °Assertive °[facility] ° [address] [place], [place] 336-834-9664   °Sharon DeEsch [address] °[place] 336-554-5454   ° °Self-Help/Support Groups °Organization         Address  Phone             Notes  °[facility] - variety of support groups  336- 373-1402 Call for more information  °Narcotics Anonymous (NA), [facility] [address], °[place]  2 meetings at this location  ° °Residential Treatment Programs °Organization           Address  Phone  Notes  [facility] [address],    [place]  8-921-194-1740   [facility]  [address], [place] [address], [place], [place] [phone]   [facility] [address] [place], [place] [phone] Admissions: 8am-3pm M-F  [facility] [address],    [place], [place] [phone]   [facility] [address] [place], [place], [place] [phone]   [facility] [address],  [place], [place] [phone]   [facility] Intensive Outpatient [address], [place], [place] [phone]   [facility] [address] [place]  [place], [place] 6-812-751-7001 or (651) [phone]   [facility] [address] [place], [place] 163-846-6599 Accepts Medicaid  [facility] [address]  [place] 3-570-177-9390 Substance Abuse/Addiction Treatment   [facility] Organization         Address  Phone  Notes  [facility]  (716)119-6162   Angie Fava, PhD [address] Ervin Knack [place], [place]   202-598-2007 or 224-248-4154   [facility]   [address] [place], [place] (920)[phone]   [facility] [address], [place], [place] [phone])[phone] Insurance/Medicaid/sponsorship through [facility] [address]                                    [place], [place] [phone] Therapy/tele-psych/case  [facility] [address], [place] [phone]    Dr. Lolly Mustache  ([phone]) [phone]   [facility]  [employer] [facility] ([facility]) Dept. 1) [address], [place] 2) [address] [place] 3)  [address], [place] [phone]  [phone]   [facility] Child Abuse  Hotline 864-406-2251 or [phone])[phone] (After Hours)

## 2014-11-21 NOTE — ED Notes (Signed)
Pt returned from xray. PA at bedside 

## 2014-11-21 NOTE — ED Notes (Signed)
Pt made aware to return if symptoms worsen or if any life threatening symptoms occur.   

## 2014-11-22 LAB — GC/CHLAMYDIA PROBE AMP (~~LOC~~) NOT AT ARMC
Chlamydia: NEGATIVE
Neisseria Gonorrhea: NEGATIVE

## 2014-11-23 LAB — URINE CULTURE: Colony Count: >=100000

## 2014-11-24 ENCOUNTER — Telehealth (HOSPITAL_BASED_OUTPATIENT_CLINIC_OR_DEPARTMENT_OTHER): Payer: Self-pay | Admitting: Emergency Medicine

## 2014-11-24 NOTE — Telephone Encounter (Signed)
Post ED Visit - Positive Culture Follow-up  Culture report reviewed by antimicrobial stewardship pharmacist: []  [name], Pharm.D., BCPS [x]  [name], Pharm.D., BCPS []  [name], Pharm.D., BCPS []  [name], VermontPharm.D., BCPS, AAHIVP []  [name], Pharm.D., BCPS, AAHIVP []  [name], [address] BoulevardPharm.D., BCPS  Positive urine culture E. Coli Treated with Cephalexin, organism sensitive to the same and no further patient follow-up is required at this time.  [name], Ebelin Dillehay 11/24/2014, 11:17 AM

## 2015-01-31 ENCOUNTER — Inpatient Hospital Stay (HOSPITAL_COMMUNITY): Payer: Medicaid Other

## 2015-01-31 ENCOUNTER — Encounter (HOSPITAL_COMMUNITY): Payer: Self-pay | Admitting: *Deleted

## 2015-01-31 ENCOUNTER — Inpatient Hospital Stay (HOSPITAL_COMMUNITY)
Admission: AD | Admit: 2015-01-31 | Discharge: 2015-01-31 | Disposition: A | Discharge status: Home / Self Care | Payer: Medicaid Other | Source: Ambulatory Visit | Attending: Obstetrics & Gynecology | Admitting: Obstetrics & Gynecology

## 2015-01-31 DIAGNOSIS — O3680X0 Pregnancy with inconclusive fetal viability, not applicable or unspecified: Secondary | ICD-10-CM

## 2015-01-31 DIAGNOSIS — F17210 Nicotine dependence, cigarettes, uncomplicated: Secondary | ICD-10-CM | POA: Diagnosis not present

## 2015-01-31 DIAGNOSIS — O99331 Smoking (tobacco) complicating pregnancy, first trimester: Secondary | ICD-10-CM | POA: Insufficient documentation

## 2015-01-31 DIAGNOSIS — R109 Unspecified abdominal pain: Secondary | ICD-10-CM

## 2015-01-31 DIAGNOSIS — Z3A01 Less than 8 weeks gestation of pregnancy: Secondary | ICD-10-CM | POA: Diagnosis not present

## 2015-01-31 DIAGNOSIS — O9989 Other specified diseases and conditions complicating pregnancy, childbirth and the puerperium: Secondary | ICD-10-CM | POA: Diagnosis not present

## 2015-01-31 DIAGNOSIS — O26899 Other specified pregnancy related conditions, unspecified trimester: Secondary | ICD-10-CM

## 2015-01-31 HISTORY — DX: Anxiety disorder, unspecified: F41.9

## 2015-01-31 HISTORY — DX: Major depressive disorder, single episode, unspecified: F32.9

## 2015-01-31 HISTORY — DX: Depression, unspecified: F32.A

## 2015-01-31 HISTORY — DX: Anemia, unspecified: D64.9

## 2015-01-31 LAB — URINALYSIS, ROUTINE W REFLEX MICROSCOPIC
Bilirubin Urine: NEGATIVE
Glucose, UA: NEGATIVE mg/dL
HGB URINE DIPSTICK: NEGATIVE
Ketones, ur: NEGATIVE mg/dL
Leukocytes, UA: NEGATIVE
Nitrite: NEGATIVE
Protein, ur: NEGATIVE mg/dL
Specific Gravity, Urine: 1.025 (ref 1.005–1.030)
Urobilinogen, UA: >8 mg/dL — ABNORMAL HIGH (ref 0.0–1.0)
pH: 7 (ref 5.0–8.0)

## 2015-01-31 LAB — POCT PREGNANCY, URINE: Preg Test, Ur: POSITIVE — AB

## 2015-01-31 LAB — CBC
HCT: 32.6 % — ABNORMAL LOW (ref 36.0–46.0)
Hemoglobin: 11.3 g/dL — ABNORMAL LOW (ref 12.0–15.0)
MCH: 30.1 pg (ref 26.0–34.0)
MCHC: 34.7 g/dL (ref 30.0–36.0)
MCV: 86.9 fL (ref 78.0–100.0)
PLATELETS: 238 10*3/uL (ref 150–400)
RBC: 3.75 MIL/uL — AB (ref 3.87–5.11)
RDW: 13.6 % (ref 11.5–15.5)
WBC: 7.1 10*3/uL (ref 4.0–10.5)

## 2015-01-31 LAB — WET PREP, GENITAL
Clue Cells Wet Prep HPF POC: NONE SEEN
Trich, Wet Prep: NONE SEEN
WBC, Wet Prep HPF POC: NONE SEEN
Yeast Wet Prep HPF POC: NONE SEEN

## 2015-01-31 LAB — HCG, QUANTITATIVE, PREGNANCY: HCG, BETA CHAIN, QUANT, S: 1312 m[IU]/mL — AB (ref <5)

## 2015-01-31 NOTE — MAU Provider Note (Signed)
History     CSN: [id number]  Arrival date and time: 01/31/15 1429   First Provider Initiated Contact with Patient 01/31/15 1514      Chief Complaint  Patient presents with  . Abdominal Pain  . Vaginal Discharge   HPI   [name] is a 29 y.o. female [phone] at [redacted]w[redacted]d who presents with abdominal pain and vaginal discharge. She was unaware that she is pregnant.   Vaginal discharge: The vaginal discharge started a couple of days ago. It is described as creamy and white with an odor. She does have a new sexual partner. She has not tried anything over the counter for the symptoms.   Abdominal pain: started three days ago, the pain comes and goes. When the pain comes it shoots in the middle of her stomach to the bottom of her stomach.  She has tried tylenol over the counter without relief. Denies vaginal bleeding.   OB History    Gravida Para Term Preterm AB TAB SAB Ectopic Multiple Living   0 1 0 1 0 0 3      Past Medical History  Diagnosis Date  . Chlamydia   . Medical history non-contributory   . Anemia   . Anxiety   . Depression   . Chronic kidney disease     kidney infection     Past Surgical History  Procedure Laterality Date  . No past surgeries      Family History  Problem Relation Age of Onset  . Hypertension Maternal Grandfather   . Diabetes Maternal Grandfather   . Hypertension Mother     History  Substance Use Topics  . Smoking status: Current Every Day Smoker -- 0.50 packs/day for 10 years    Types: Cigarettes  . Smokeless tobacco: Never Used  . Alcohol Use: Yes     Comment: occasional    Allergies: No Known Allergies  Facility-administered medications prior to admission  Medication Dose Route Frequency Provider Last Rate Last Dose  . TDaP (BOOSTRIX) injection 0.5 mL  0.5 mL Intramuscular Once Minta Balsam, MD       Prescriptions prior to admission  Medication Sig Dispense Refill Last Dose  . acetaminophen (TYLENOL) 325 MG  tablet Take 325 mg by mouth every 6 (six) hours as needed.   01/30/2015 at Unknown time  . cephALEXin (KEFLEX) 500 MG capsule Take 1 capsule (500 mg total) by mouth 4 (four) times daily. (Patient not taking: Reported on 01/31/2015) 40 capsule 0   . HYDROcodone-acetaminophen (NORCO/VICODIN) 5-325 MG per tablet Take 1-2 tablets by mouth every 6 (six) hours as needed for moderate pain or severe pain. (Patient not taking: Reported on 01/31/2015) 9 tablet 0    Results for orders placed or performed during the hospital encounter of 01/31/15 (from the past 48 hour(s))  Urinalysis, Routine w reflex microscopic     Status: Abnormal   Collection Time: 01/31/15  2:43 PM  Result Value Ref Range   Color, Urine YELLOW YELLOW   APPearance CLEAR CLEAR   Specific Gravity, Urine 1.025 1.005 - 1.030   pH 7.0 5.0 - 8.0   Glucose, UA NEGATIVE NEGATIVE mg/dL   Hgb urine dipstick NEGATIVE NEGATIVE   Bilirubin Urine NEGATIVE NEGATIVE   Ketones, ur NEGATIVE NEGATIVE mg/dL   Protein, ur NEGATIVE NEGATIVE mg/dL   Urobilinogen, UA >1.4 (H) 0.0 - 1.0 mg/dL   Nitrite NEGATIVE NEGATIVE   Leukocytes, UA NEGATIVE NEGATIVE    Comment: MICROSCOPIC NOT  DONE ON URINES WITH NEGATIVE PROTEIN, BLOOD, LEUKOCYTES, NITRITE, OR GLUCOSE <1000 mg/dL.  Wet prep, genital     Status: None   Collection Time: 01/31/15  3:23 PM  Result Value Ref Range   Yeast Wet Prep HPF POC NONE SEEN NONE SEEN   Trich, Wet Prep NONE SEEN NONE SEEN   Clue Cells Wet Prep HPF POC NONE SEEN NONE SEEN   WBC, Wet Prep HPF POC NONE SEEN NONE SEEN    Comment: FEW BACTERIA SEEN  CBC     Status: Abnormal   Collection Time: 01/31/15  3:27 PM  Result Value Ref Range   WBC 7.1 4.0 - 10.5 K/uL   RBC 3.75 (L) 3.87 - 5.11 MIL/uL   Hemoglobin 11.3 (L) 12.0 - 15.0 g/dL   HCT 16.132.6 (L) 09.636.0 - 04.546.0 %   MCV 86.9 78.0 - 100.0 fL   MCH 30.1 26.0 - 34.0 pg   MCHC 34.7 30.0 - 36.0 g/dL   RDW 40.913.6 81.111.5 - 91.415.5 %   Platelets 238 150 - 400 K/uL  hCG, quantitative, pregnancy      Status: Abnormal   Collection Time: 01/31/15  3:27 PM  Result Value Ref Range   hCG, Beta Chain, Quant, S 1312 (H) <5 mIU/mL    Comment:          GEST. AGE      CONC.  (mIU/mL)   <=1 WEEK        5 - 50     2 WEEKS       50 - 500     3 WEEKS       100 - 10,000     4 WEEKS     1,000 - 30,000     5 WEEKS     3,500 - 115,000   6-8 WEEKS     12,000 - 270,000    12 WEEKS     15,000 - 220,000        FEMALE AND NON-PREGNANT FEMALE:     LESS THAN 5 mIU/mL     [place] Ob Comp Less 14 Wks  01/31/2015   CLINICAL DATA:  Pregnant, right lower quadrant pain x3 days  EXAM: OBSTETRIC <14 WK US AND TRANSVAGINAL OB US  TECHNIQUE: Both transabdominal and transvaginal ultrasound examinations were performed for complete evaluation of the gestation as well as the maternal uterus, adnexal regions, and pelvic cul-de-sac. Transvaginal technique was performed to assess early pregnancy.  COMPARISON:  None.  FINDINGS: Intrauterine gestational sac: Visualized/normal in shape.  Yolk sac:  Not visualized  Embryo:  Not visualized  MSD: 2.3  mm   4 w   5  d  Maternal uterus/adnexae: No subchronic hemorrhage.  Right ovary is within normal limits, measuring 4.4 x 1.8 x 3.8 cm.  Left ovary is within normal limits, measuring 3.2 x 1.3 x 3.3 cm.  Small pelvic fluid/ascites.  IMPRESSION: Suspected single small intrauterine gestational sac, measuring 4 weeks 5 days by mean sac diameter.  No yolk sac or fetal pole is visualized.  Consider follow-up pelvic ultrasound in 14 days to confirm viability.   Electronically Signed   By: Charline BillsSriyesh  Krishnan M.D.   On: 01/31/2015 16:33   [place] Ob Transvaginal  01/31/2015   CLINICAL DATA:  Pregnant, right lower quadrant pain x3 days  EXAM: OBSTETRIC <14 WK US AND TRANSVAGINAL OB US  TECHNIQUE: Both transabdominal and transvaginal ultrasound examinations were performed for complete evaluation of the gestation as well as the maternal uterus,  adnexal regions, and pelvic cul-de-sac. Transvaginal technique  was performed to assess early pregnancy.  COMPARISON:  None.  FINDINGS: Intrauterine gestational sac: Visualized/normal in shape.  Yolk sac:  Not visualized  Embryo:  Not visualized  MSD: 2.3  mm   4 w   5  d  Maternal uterus/adnexae: No subchronic hemorrhage.  Right ovary is within normal limits, measuring 4.4 x 1.8 x 3.8 cm.  Left ovary is within normal limits, measuring 3.2 x 1.3 x 3.3 cm.  Small pelvic fluid/ascites.  IMPRESSION: Suspected single small intrauterine gestational sac, measuring 4 weeks 5 days by mean sac diameter.  No yolk sac or fetal pole is visualized.  Consider follow-up pelvic ultrasound in 14 days to confirm viability.   Electronically Signed   By: Charline BillsSriyesh  Krishnan M.D.   On: 01/31/2015 16:33    Review of Systems  Constitutional: Negative for fever and chills.  Gastrointestinal: Positive for abdominal pain. Negative for nausea and vomiting.  Genitourinary: Negative for dysuria, urgency and frequency.   Physical Exam   Blood pressure 123/57, pulse 77, temperature 98.4 F (36.9 C), temperature source Oral, resp. rate 18, height 5\' 2"  (1.575 m), weight 58.423 kg (128 lb 12.8 oz), last menstrual period 12/31/2014, not currently breastfeeding.  Physical Exam  Constitutional: She is oriented to person, place, and time. She appears well-developed and well-nourished. No distress.  HENT:  Head: Normocephalic.  Eyes: Pupils are equal, round, and reactive to light.  Neck: Neck supple.  Respiratory: Effort normal.  GI: Soft. She exhibits no distension. There is no tenderness. There is no rebound.  Genitourinary:  Speculum exam: Vagina - Scant amount of creamy discharge, no odor. Vagina appears dry Cervix - No contact bleeding Bimanual exam: Cervix closed Uterus non tender, normal size Adnexa non tender, no masses bilaterally GC/Chlam, wet prep done Chaperone present for exam.  Musculoskeletal: Normal range of motion.  Neurological: She is alert and oriented to person,  place, and time.  Skin: Skin is warm. She is not diaphoretic.  Psychiatric: Her behavior is normal.    MAU Course  Procedures  None  MDM  US shows suspected IUP without yolk sac; cannot officially rule out and ectopic pregnancy.    Assessment and Plan   A:  1. Pregnancy of unknown anatomic location   2. Abdominal pain in pregnancy, antepartum    P:  Discharge home in stable condition Ectopic precautions Return in 48 hours for beta hcg level (patient to go to [facility] at 1030) Return to MAU if symptoms worsen Pelvic rest   Duane LopeJennifer I Phat Dalton, NP 01/31/2015 4:23 PM

## 2015-01-31 NOTE — MAU Note (Signed)
Pt to MAU w/ c/o vaginal discharge and mid to lower abdominal pain. Unsure if she is pregnant. Had neg hpt. Denies vaginal bleeding.

## 2015-01-31 NOTE — Discharge Instructions (Signed)

## 2015-02-01 LAB — GC/CHLAMYDIA PROBE AMP (~~LOC~~) NOT AT ARMC
Chlamydia: POSITIVE — AB
NEISSERIA GONORRHEA: NEGATIVE

## 2015-02-01 LAB — HIV ANTIBODY (ROUTINE TESTING W REFLEX): HIV Screen 4th Generation wRfx: NONREACTIVE

## 2015-02-02 ENCOUNTER — Telehealth: Payer: Self-pay | Admitting: Medical

## 2015-02-02 ENCOUNTER — Telehealth (HOSPITAL_COMMUNITY): Payer: Self-pay | Admitting: *Deleted

## 2015-02-02 ENCOUNTER — Other Ambulatory Visit: Payer: Medicaid Other

## 2015-02-02 DIAGNOSIS — O3680X0 Pregnancy with inconclusive fetal viability, not applicable or unspecified: Secondary | ICD-10-CM

## 2015-02-02 DIAGNOSIS — O98811 Other maternal infectious and parasitic diseases complicating pregnancy, first trimester: Principal | ICD-10-CM

## 2015-02-02 DIAGNOSIS — A749 Chlamydial infection, unspecified: Secondary | ICD-10-CM

## 2015-02-02 DIAGNOSIS — O039 Complete or unspecified spontaneous abortion without complication: Secondary | ICD-10-CM

## 2015-02-02 LAB — HCG, QUANTITATIVE, PREGNANCY: HCG, BETA CHAIN, QUANT, S: 2816 m[IU]/mL

## 2015-02-02 MED ORDER — AZITHROMYCIN 500 MG PO TABS: ORAL_TABLET | ORAL | Status: DC

## 2015-02-02 NOTE — MAU Note (Signed)
RN reprinted discharge instructions for pt.

## 2015-02-02 NOTE — Telephone Encounter (Signed)
LM with patient's mother to have her return call to MAU for information. Quant hCG drawn in [facility] on 02/02/15 showed appropriate rise for normal pregnancy. US ordered for 02/09/15. They will call the patient with an appointment time. Should be between 1pm-4pm if ok with the patient. She will return to MAU for results after US or sooner PRN.   Marny LowensteinJulie N Wenzel, PA-C 02/02/2015 3:15 PM

## 2015-02-02 NOTE — Telephone Encounter (Signed)
Telephone call to patient regarding positive chlamydia culture, patient notified.  Patient has not been treated and Rx called in per protocol to patient's pharmacy.  Instructed patient to notify her partner and to abstain from sex for 7 days after both has been treated.  Report faxed to health department.

## 2015-02-09 ENCOUNTER — Ambulatory Visit (HOSPITAL_COMMUNITY): Admission: RE | Admit: 2015-02-09 | Payer: Medicaid Other | Source: Ambulatory Visit

## 2015-02-10 IMAGING — US US OB COMP LESS 14 WK
1 series · 14 of 28 positions shown · non-contrast
Comparison: 02/16/2014

CLINICAL DATA: Pelvic pain and bleeding

EXAM:
OBSTETRIC <14 WK US AND TRANSVAGINAL OB US
TECHNIQUE: Both transabdominal and transvaginal ultrasound examinations were
performed for complete evaluation of the gestation as well as the
maternal uterus, adnexal regions, and pelvic cul-de-sac.
Transvaginal technique was performed to assess early pregnancy.

[Series 1: us ob comp less 14 wk · 0.21mm/px · 14 of 63 slices shown]
[im 3/63]
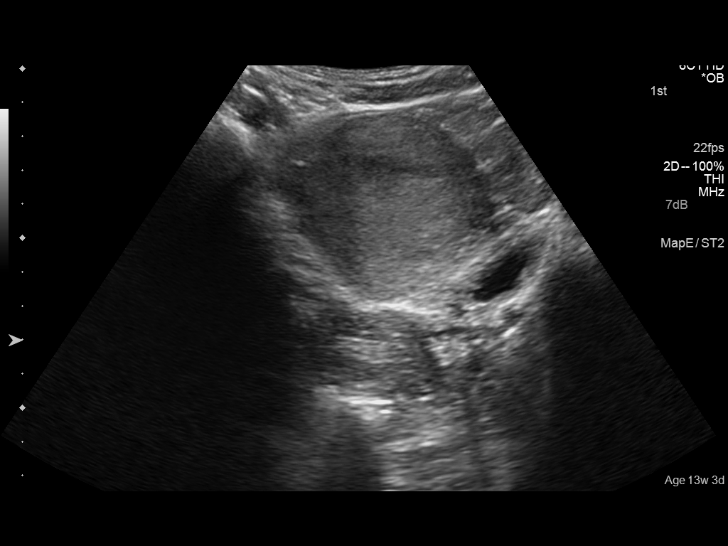
[im 7/63]
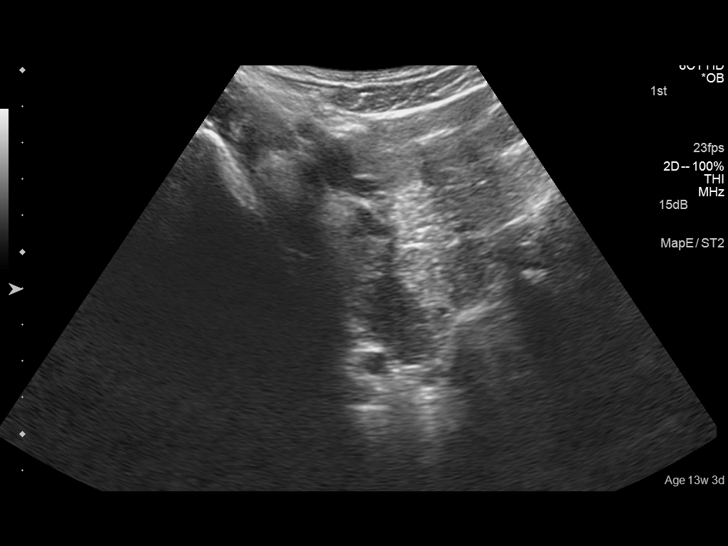
[im 12/63]
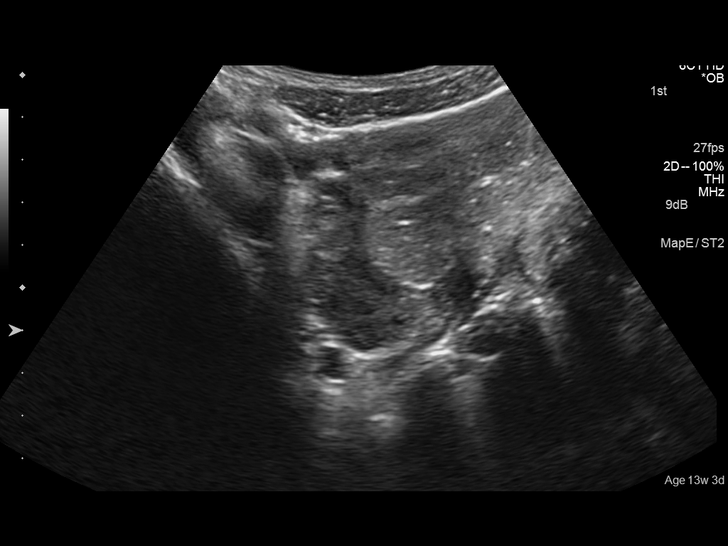
[im 17/63]
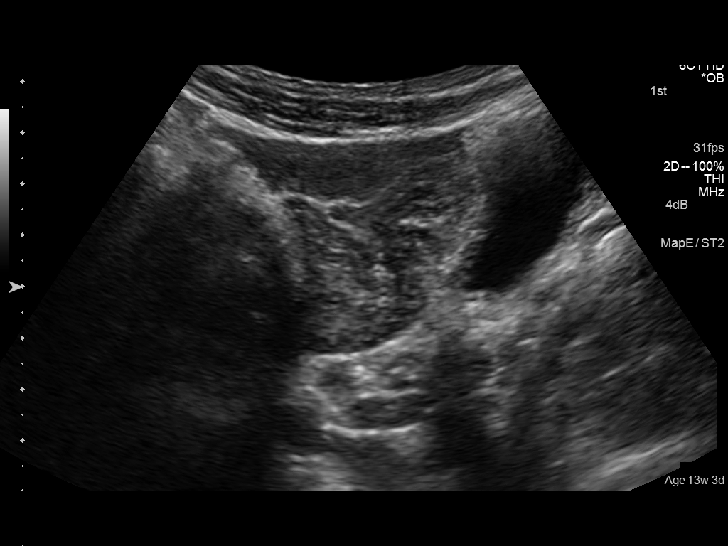
[im 21/63]
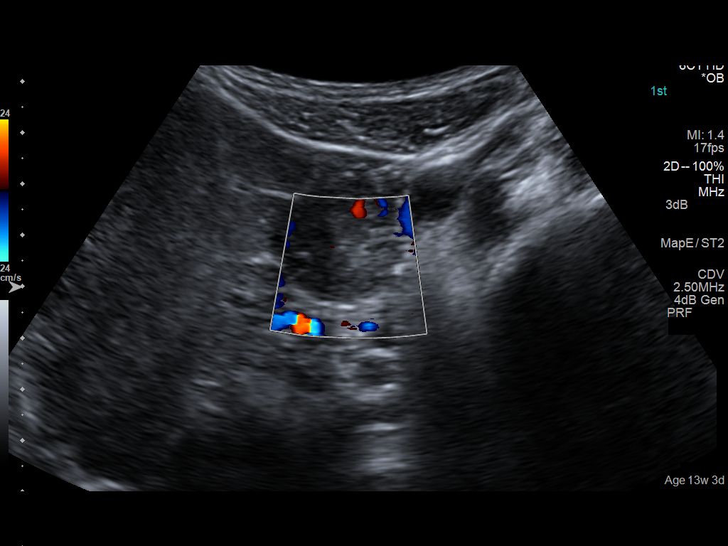
[im 26/63]
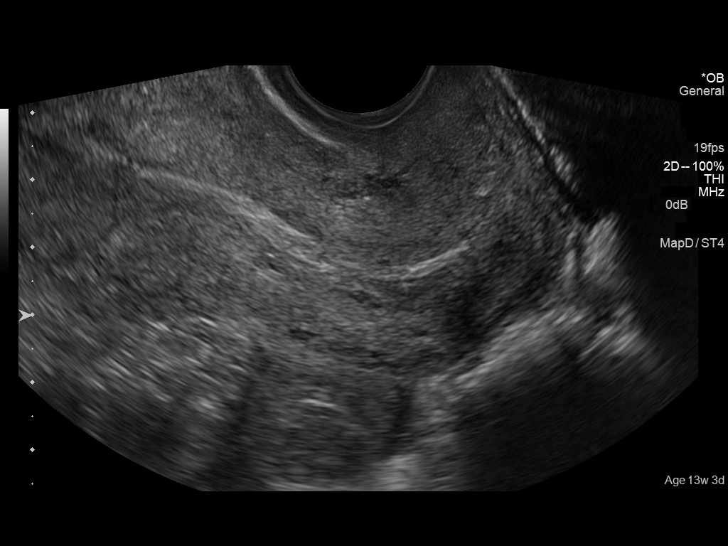
[im 30/63]
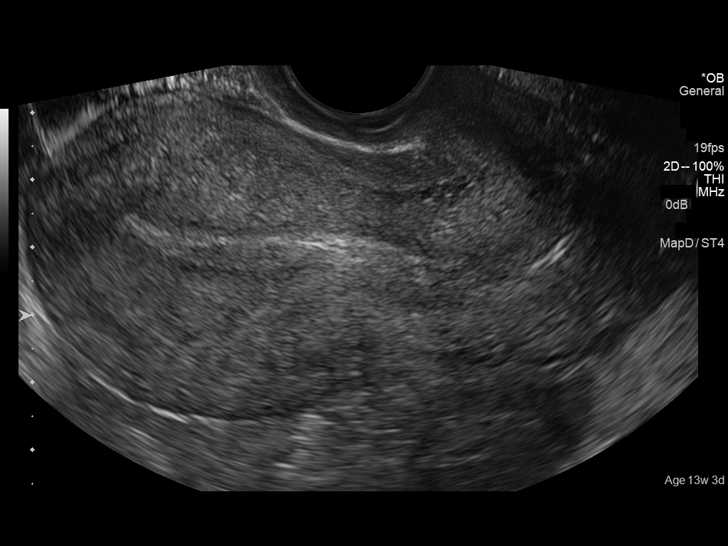
[im 35/63]
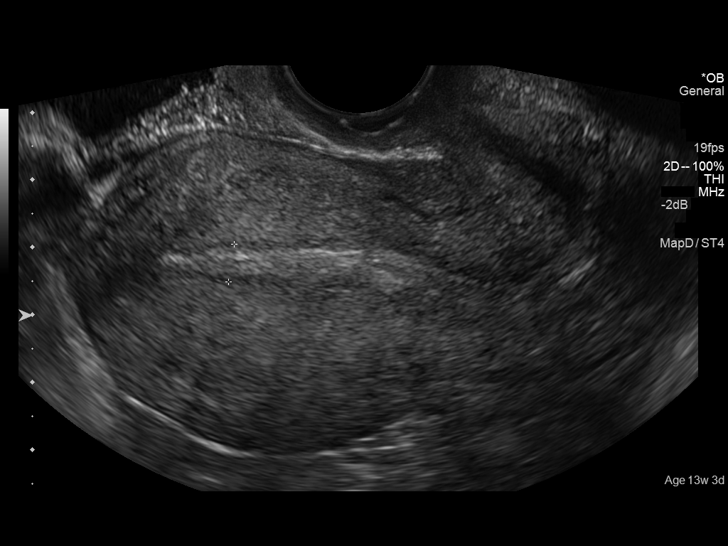
[im 40/63]
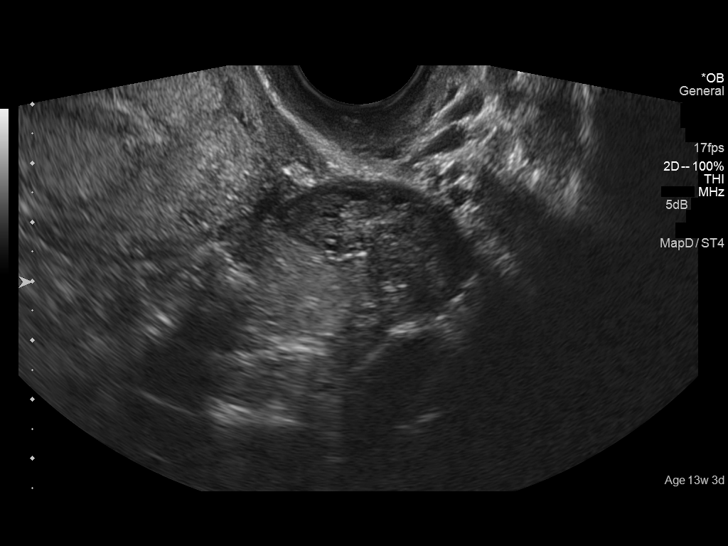
[im 44/63]
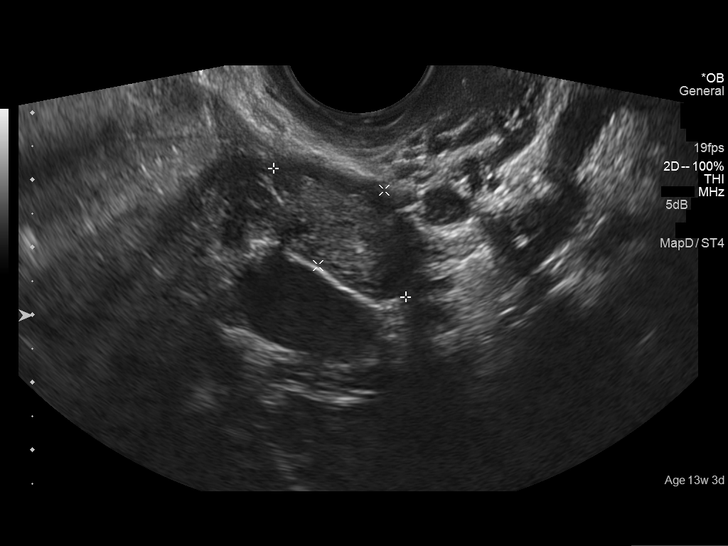
[im 49/63]
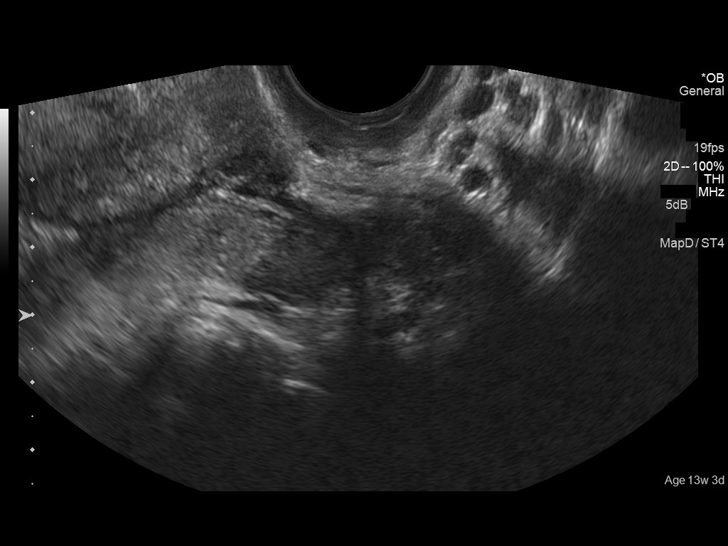
[im 53/63]
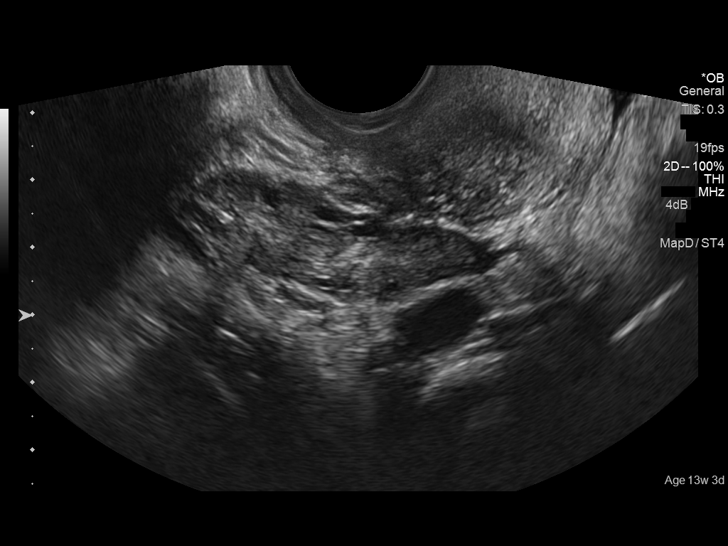
[im 58/63]
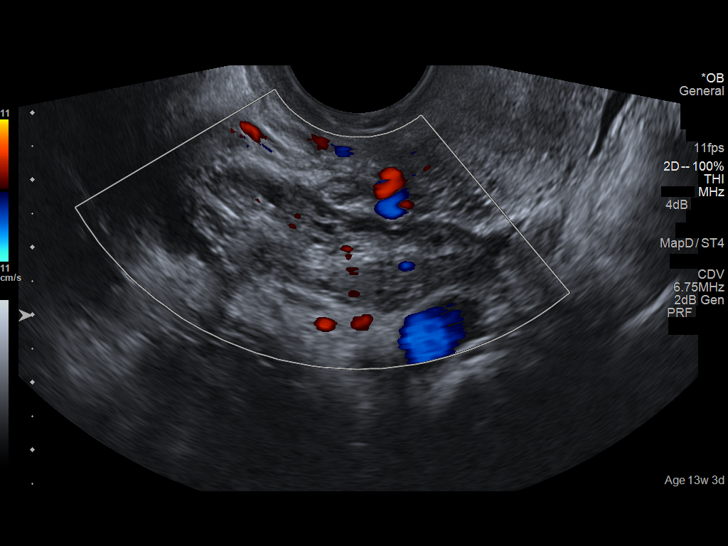
[im 63/63]
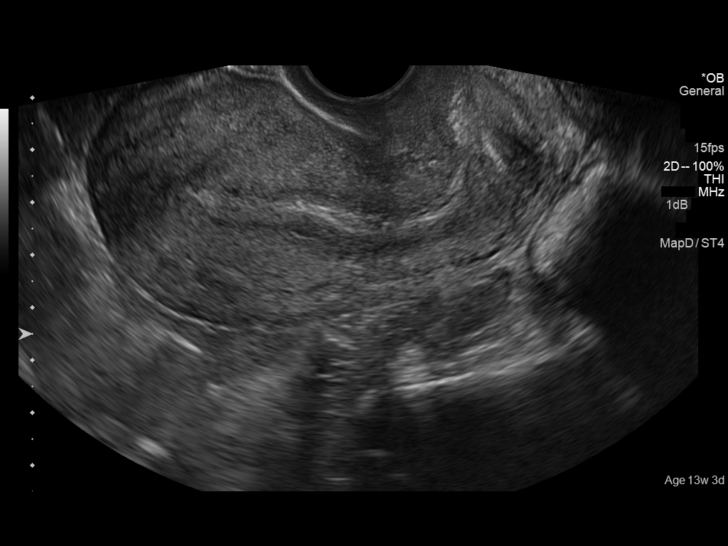

[14 of 28 positions shown; findings below may reference images not displayed]

FINDINGS: Intrauterine gestational sac: The previously seen gestational sac is
no longer identified.

Yolk sac:  None

Embryo:  None

Cardiac Activity: None

Maternal uterus/adnexa: The uterus measures 9.2 x 4.5 x 5.6 cm. The
endometrium measures 6 mm in thickness.

The right ovary measures 4.4 x 1.3 x 1.5 cm. The left ovary measures
2.7 x 1.5 x 3.4 cm. No acute ovarian abnormality is seen.
IMPRESSION: There is been interval loss of the gestational sac when compared
with the prior exam. This is consistent with a spontaneous abortion.

The maternal pelvic organs are within normal limits.

## 2015-02-27 ENCOUNTER — Ambulatory Visit (HOSPITAL_COMMUNITY): Payer: Medicaid Other

## 2015-03-01 ENCOUNTER — Ambulatory Visit (HOSPITAL_COMMUNITY)
Admission: RE | Admit: 2015-03-01 | Discharge: 2015-03-01 | Disposition: A | Discharge status: Home / Self Care | Payer: Medicaid Other | Source: Ambulatory Visit | Attending: Medical | Admitting: Medical

## 2015-03-01 ENCOUNTER — Inpatient Hospital Stay (HOSPITAL_COMMUNITY)
Admission: AD | Admit: 2015-03-01 | Discharge: 2015-03-01 | Disposition: A | Discharge status: Home / Self Care | Payer: Medicaid Other | Source: Ambulatory Visit | Attending: Obstetrics & Gynecology | Admitting: Obstetrics & Gynecology

## 2015-03-01 ENCOUNTER — Encounter (HOSPITAL_COMMUNITY): Payer: Self-pay | Admitting: Medical

## 2015-03-01 ENCOUNTER — Other Ambulatory Visit: Payer: Self-pay | Admitting: Medical

## 2015-03-01 DIAGNOSIS — Z36 Encounter for antenatal screening of mother: Secondary | ICD-10-CM | POA: Diagnosis not present

## 2015-03-01 DIAGNOSIS — Z3490 Encounter for supervision of normal pregnancy, unspecified, unspecified trimester: Secondary | ICD-10-CM

## 2015-03-01 DIAGNOSIS — O3680X0 Pregnancy with inconclusive fetal viability, not applicable or unspecified: Secondary | ICD-10-CM

## 2015-03-01 MED ORDER — PRENATAL COMPLETE 14-0.4 MG PO TABS: 1.0000 | ORAL_TABLET | Freq: Every day | ORAL | Status: DC

## 2015-03-01 NOTE — MAU Provider Note (Signed)
[name] is a 29 y.o. [id number] at [redacted]w[redacted]d who presents to MAU today for follow-up US results. She denies abdominal pain, vaginal bleeding, N/V or fever today.   LMP 12/31/2014 (Approximate)  GENERAL: Well-developed, well-nourished female in no acute distress.  HEENT: Normocephalic, atraumatic.   LUNGS: Effort normal HEART: Regular rate  SKIN: Warm, dry and without erythema PSYCH: Normal mood and affect  US Ob Comp Less 14 Wks  03/01/2015   CLINICAL DATA:  Followup viability/dating  EXAM: OBSTETRIC <14 WK ULTRASOUND  TECHNIQUE: Transabdominal ultrasound was performed for evaluation of the gestation as well as the maternal uterus and adnexal regions.  COMPARISON:  01/31/2015  FINDINGS: Intrauterine gestational sac: Single  Yolk sac:  Yes  Embryo:  Yes  Cardiac Activity: Present  Heart Rate: 172 bpm  CRL:   23.3  mm   9 w 1 d                  [place] EDC: 10/03/2015  Maternal uterus/adnexae:  Subchorionic hemorrhage: None  Right ovary: 4 x 1.6 x 2.4 cm  Left ovary: 3 x 2.0 x 1.8 cm  Other :None  Free fluid:  None  IMPRESSION: 1. Single living intrauterine gestation. The estimated gestational age is 9 weeks and 1 day.   Electronically Signed   By: Signa Kell M.D.   On: 03/01/2015 15:10    A: [facility] at [redacted]w[redacted]d  P: Discharge home Rx for prenatal vitamins sent to patient's pharmacy at her request First trimester warning signs discussed Patient advised to start prenatal care with provider of choice Pregnancy confirmation letter given Patient may return to MAU as needed or if her condition were to change or worsen   Marny Lowenstein, PA-C  03/01/2015 3:21 PM

## 2015-03-01 NOTE — Discharge Instructions (Signed)
First Trimester of Pregnancy The first trimester of pregnancy is from week 1 until the end of week 12 (months 1 through 3). During this time, your baby will begin to develop inside you. At 6-8 weeks, the eyes and face are formed, and the heartbeat can be seen on ultrasound. At the end of 12 weeks, all the baby's organs are formed. Prenatal care is all the medical care you receive before the birth of your baby. Make sure you get good prenatal care and follow all of your doctor's instructions. HOME CARE  Medicines  Take medicine only as told by your doctor. Some medicines are safe and some are not during pregnancy.  Take your prenatal vitamins as told by your doctor.  Take medicine that helps you poop (stool softener) as needed if your doctor says it is okay. Diet  Eat regular, healthy meals.  Your doctor will tell you the amount of weight gain that is right for you.  Avoid raw meat and uncooked cheese.  If you feel sick to your stomach (nauseous) or throw up (vomit):  Eat 4 or 5 small meals a day instead of 3 large meals.  Try eating a few soda crackers.  Drink liquids between meals instead of during meals.  If you have a hard time pooping (constipation):  Eat high-fiber foods like fresh vegetables, fruit, and whole grains.  Drink enough fluids to keep your pee (urine) clear or pale yellow. Activity and Exercise  Exercise only as told by your doctor. Stop exercising if you have cramps or pain in your lower belly (abdomen) or low back.  Try to avoid standing for long periods of time. Move your legs often if you must stand in one place for a long time.  Avoid heavy lifting.  Wear low-heeled shoes. Sit and stand up straight.  You can have sex unless your doctor tells you not to. Relief of Pain or Discomfort  Wear a good support bra if your breasts are sore.  Take warm water baths (sitz baths) to soothe pain or discomfort caused by hemorrhoids. Use hemorrhoid cream if your  doctor says it is okay.  Rest with your legs raised if you have leg cramps or low back pain.  Wear support hose if you have puffy, bulging veins (varicose veins) in your legs. Raise (elevate) your feet for 15 minutes, 3-4 times a day. Limit salt in your diet. Prenatal Care  Schedule your prenatal visits by the twelfth week of pregnancy.  Write down your questions. Take them to your prenatal visits.  Keep all your prenatal visits as told by your doctor. Safety  Wear your seat belt at all times when driving.  Make a list of emergency phone numbers. The list should include numbers for family, friends, the hospital, and police and fire departments. General Tips  Ask your doctor for a referral to a local prenatal class. Begin classes no later than at the start of month 6 of your pregnancy.  Ask for help if you need counseling or help with nutrition. Your doctor can give you advice or tell you where to go for help.  Do not use hot tubs, steam rooms, or saunas.  Do not douche or use tampons or scented sanitary pads.  Do not cross your legs for long periods of time.  Avoid litter boxes and soil used by cats.  Avoid all smoking, herbs, and alcohol. Avoid drugs not approved by your doctor.  Visit your dentist. At home, brush your teeth   with a soft toothbrush. Be gentle when you floss. GET HELP IF:  You are dizzy.  You have mild cramps or pressure in your lower belly.  You have a nagging pain in your belly area.  You continue to feel sick to your stomach, throw up, or have watery poop (diarrhea).  You have a bad smelling fluid coming from your vagina.  You have pain with peeing (urination).  You have increased puffiness (swelling) in your face, hands, legs, or ankles. GET HELP RIGHT AWAY IF:   You have a fever.  You are leaking fluid from your vagina.  You have spotting or bleeding from your vagina.  You have very bad belly cramping or pain.  You gain or lose weight  rapidly.  You throw up blood. It may look like coffee grounds.  You are around people who have German measles, fifth disease, or chickenpox.  You have a very bad headache.  You have shortness of breath.  You have any kind of trauma, such as from a fall or a car accident. Document Released: 02/25/2008 Document Revised: 01/23/2014 Document Reviewed: 07/19/2013 [employer] Patient Information 2015 [employer]. This information is not intended to replace advice given to you by your health care provider. Make sure you discuss any questions you have with your health care provider.  

## 2015-03-27 ENCOUNTER — Emergency Department (HOSPITAL_COMMUNITY): Payer: Medicaid Other

## 2015-03-27 ENCOUNTER — Emergency Department (HOSPITAL_COMMUNITY)
Admission: EM | Admit: 2015-03-27 | Discharge: 2015-03-27 | Disposition: A | Discharge status: Home / Self Care | Payer: Medicaid Other | Attending: Emergency Medicine | Admitting: Emergency Medicine

## 2015-03-27 ENCOUNTER — Encounter (HOSPITAL_COMMUNITY): Payer: Self-pay | Admitting: Emergency Medicine

## 2015-03-27 DIAGNOSIS — Z79899 Other long term (current) drug therapy: Secondary | ICD-10-CM | POA: Diagnosis not present

## 2015-03-27 DIAGNOSIS — Y9289 Other specified places as the place of occurrence of the external cause: Secondary | ICD-10-CM | POA: Diagnosis not present

## 2015-03-27 DIAGNOSIS — S028XXA Fractures of other specified skull and facial bones, initial encounter for closed fracture: Secondary | ICD-10-CM | POA: Insufficient documentation

## 2015-03-27 DIAGNOSIS — S0502XA Injury of conjunctiva and corneal abrasion without foreign body, left eye, initial encounter: Secondary | ICD-10-CM | POA: Diagnosis present

## 2015-03-27 DIAGNOSIS — Z720 Tobacco use: Secondary | ICD-10-CM | POA: Insufficient documentation

## 2015-03-27 DIAGNOSIS — N189 Chronic kidney disease, unspecified: Secondary | ICD-10-CM | POA: Diagnosis not present

## 2015-03-27 DIAGNOSIS — Z8619 Personal history of other infectious and parasitic diseases: Secondary | ICD-10-CM | POA: Diagnosis not present

## 2015-03-27 DIAGNOSIS — Y998 Other external cause status: Secondary | ICD-10-CM | POA: Diagnosis not present

## 2015-03-27 DIAGNOSIS — Y9389 Activity, other specified: Secondary | ICD-10-CM | POA: Diagnosis not present

## 2015-03-27 DIAGNOSIS — S199XXA Unspecified injury of neck, initial encounter: Secondary | ICD-10-CM | POA: Insufficient documentation

## 2015-03-27 DIAGNOSIS — Z8659 Personal history of other mental and behavioral disorders: Secondary | ICD-10-CM | POA: Diagnosis not present

## 2015-03-27 DIAGNOSIS — H1132 Conjunctival hemorrhage, left eye: Secondary | ICD-10-CM | POA: Insufficient documentation

## 2015-03-27 DIAGNOSIS — D649 Anemia, unspecified: Secondary | ICD-10-CM | POA: Insufficient documentation

## 2015-03-27 DIAGNOSIS — S02839A Fracture of medial orbital wall, unspecified side, initial encounter for closed fracture: Secondary | ICD-10-CM

## 2015-03-27 DIAGNOSIS — M542 Cervicalgia: Secondary | ICD-10-CM

## 2015-03-27 MED ORDER — OXYCODONE-ACETAMINOPHEN 5-325 MG PO TABS
1.0000 | ORAL_TABLET | Freq: Once | ORAL | Status: AC
  Administered 2015-03-27: 1 via ORAL
  Filled 2015-03-27: qty 1

## 2015-03-27 MED ORDER — KETOROLAC TROMETHAMINE 60 MG/2ML IM SOLN
30.0000 mg | Freq: Once | INTRAMUSCULAR | Status: AC
  Administered 2015-03-27: 30 mg via INTRAMUSCULAR
  Filled 2015-03-27: qty 2

## 2015-03-27 MED ORDER — TETRACAINE HCL 0.5 % OP SOLN
2.0000 [drp] | Freq: Once | OPHTHALMIC | Status: AC
  Administered 2015-03-27: 2 [drp] via OPHTHALMIC
  Filled 2015-03-27: qty 2

## 2015-03-27 MED ORDER — AMOXICILLIN-POT CLAVULANATE 875-125 MG PO TABS: 1.0000 | ORAL_TABLET | Freq: Two times a day (BID) | ORAL | Status: DC

## 2015-03-27 MED ORDER — FLUORESCEIN SODIUM 1 MG OP STRP
1.0000 | ORAL_STRIP | Freq: Once | OPHTHALMIC | Status: AC
  Administered 2015-03-27: 1 via OPHTHALMIC
  Filled 2015-03-27: qty 1

## 2015-03-27 MED ORDER — HYDROCODONE-ACETAMINOPHEN 5-325 MG PO TABS: 1.0000 | ORAL_TABLET | Freq: Four times a day (QID) | ORAL | Status: DC | PRN

## 2015-03-27 MED ORDER — ERYTHROMYCIN 5 MG/GM OP OINT: TOPICAL_OINTMENT | OPHTHALMIC | Status: DC

## 2015-03-27 MED ORDER — OXYCODONE-ACETAMINOPHEN 5-325 MG PO TABS
2.0000 | ORAL_TABLET | Freq: Once | ORAL | Status: AC
  Administered 2015-03-27: 2 via ORAL
  Filled 2015-03-27: qty 2

## 2015-03-27 MED ORDER — TETANUS-DIPHTH-ACELL PERTUSSIS 5-2.5-18.5 LF-MCG/0.5 IM SUSP
0.5000 mL | Freq: Once | INTRAMUSCULAR | Status: AC
  Administered 2015-03-27: 0.5 mL via INTRAMUSCULAR
  Filled 2015-03-27: qty 0.5

## 2015-03-27 NOTE — Discharge Instructions (Signed)
Return to the emergency room with worsening of symptoms, new symptoms or with symptoms that are concerning, especially worsening eye pain, unable to look in all fields of vision, pain with movement of eye, worsening vision OR severe worsening of headache, visual or speech changes, weakness in face, arms or legs. Call to make follow up appointment with ENT and opthamology. Do not blow your nose or sneeze. Please take all of your antibiotics until finished!   You may develop abdominal discomfort or diarrhea from the antibiotic.  You may help offset this with probiotics which you can buy or get in yogurt. Do not eat  or take the probiotics until 2 hours after your antibiotic.  Read below information and follow recommendations. Orbital Floor Fracture, Non-Blowout The eye sits in the part of the skull called the "orbit." The upper and outside walls of the orbit are thick and strong. The inside wall (near the nose) and the orbital floor are very thin and weak. The tissues around the eye will briefly press together if there is a direct blow to the front of the eye. This leads to high pressure against the orbital walls. The inside wall and the orbital floor may break since these are the weakest walls. If the orbital floor breaks, the tissues around the eye, including the muscle that makes the eye look down, may become trapped in the sinus below when the orbital floor "blows out." If a blowout does not happen, the orbital floor fracture is considered a non-blowout orbital fracture. CAUSES An orbital floor fracture can be caused by any accident in which an object hits the face or the face strikes against a hard object. The most common ways that people break their eye socket include:  Being hit by a blunt object, such as a baseball bat or a fist.  Striking the face on the car dashboard during a crash.  Falls.  Gunshot. SYMPTOMS  If there has been no injury to the eye itself, symptoms may include:  Puffiness  (swelling) and bruising around the eye area (black eye).  Numbness of the cheek and upper gum on the side with the floor fracture. This is caused by nerve injury to these areas.  Pain around the eye.  Headache.  Ear pain on the injured side. DIAGNOSIS The diagnosis of an orbital floor fracture is suspected during an eye exam by an ophthalmologist. It is confirmed by X-rays or CT scan. TREATMENT Your caregiver may suggest waiting 1 or 2 weeks for the swelling to go away before examining the eye. When the swelling lessens, your caregiver will examine the eye to see if there is any sign of a trapped muscle or double vision when looking in different directions. If double vision is not found and muscle or tissue did not get trapped, no further treatment is necessary. After that, in almost all cases, the bones heal together on their own.  HOME CARE INSTRUCTIONS  Take all pain medicine as directed by your caregiver.  Use ice packs or other cold therapy to reduce swelling as directed by your caregiver.  Do not put a contact lens in the injured eye until your caregiver approves.  Avoid dusty environments.  Always wear protective glasses or goggles when recommended. Wearing protective eyewear is not dangerous to your injured eye and will not delay healing.  As long as your other eye is seeing normally, you may return to work and drive.  You may travel by plane or be in high altitudes.  However, your swelling may take longer to go away, and you may have sinus pain.  Be aware that your depth perception and your ability to judge distance may be reduced or lost. SEEK IMMEDIATE MEDICAL CARE IF:  Your vision changes.  Your redness or swelling persists around the injured eye or gets worse.  You start to have double vision.  You have a bloody or discolored discharge from your nose.  You have a fever that lasts longer than 2 to 3 days.  You have a fever that suddenly gets worse.  Your cheek  or upper gum numbness does not go away. MAKE SURE YOU:  Understand these instructions.  Will watch your condition.  Will get help right away if you are not doing well or get worse. Document Released: 12/01/2011 Document Revised: 01/23/2014 Document Reviewed: 12/01/2011 [facility] Patient Information 2015 [employer], [place]. This information is not intended to replace advice given to you by your health care provider. Make sure you discuss any questions you have with your health care provider.
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
## 2015-03-27 NOTE — ED Notes (Signed)
Pt placed in a gown and hooked up to the monitor with the BP cuff and pulse ox 

## 2015-06-12 IMAGING — CR DG SHOULDER 1V*L*
1 series · 2 of 2 positions shown · non-contrast
Comparison: July 30, 2011

CLINICAL DATA: Patient fell with pain

EXAM:
LEFT SHOULDER - 2 VIEW

[Series 1: ap int/ext rotation · left · 2 of 2 slices shown]
[im 1/2]
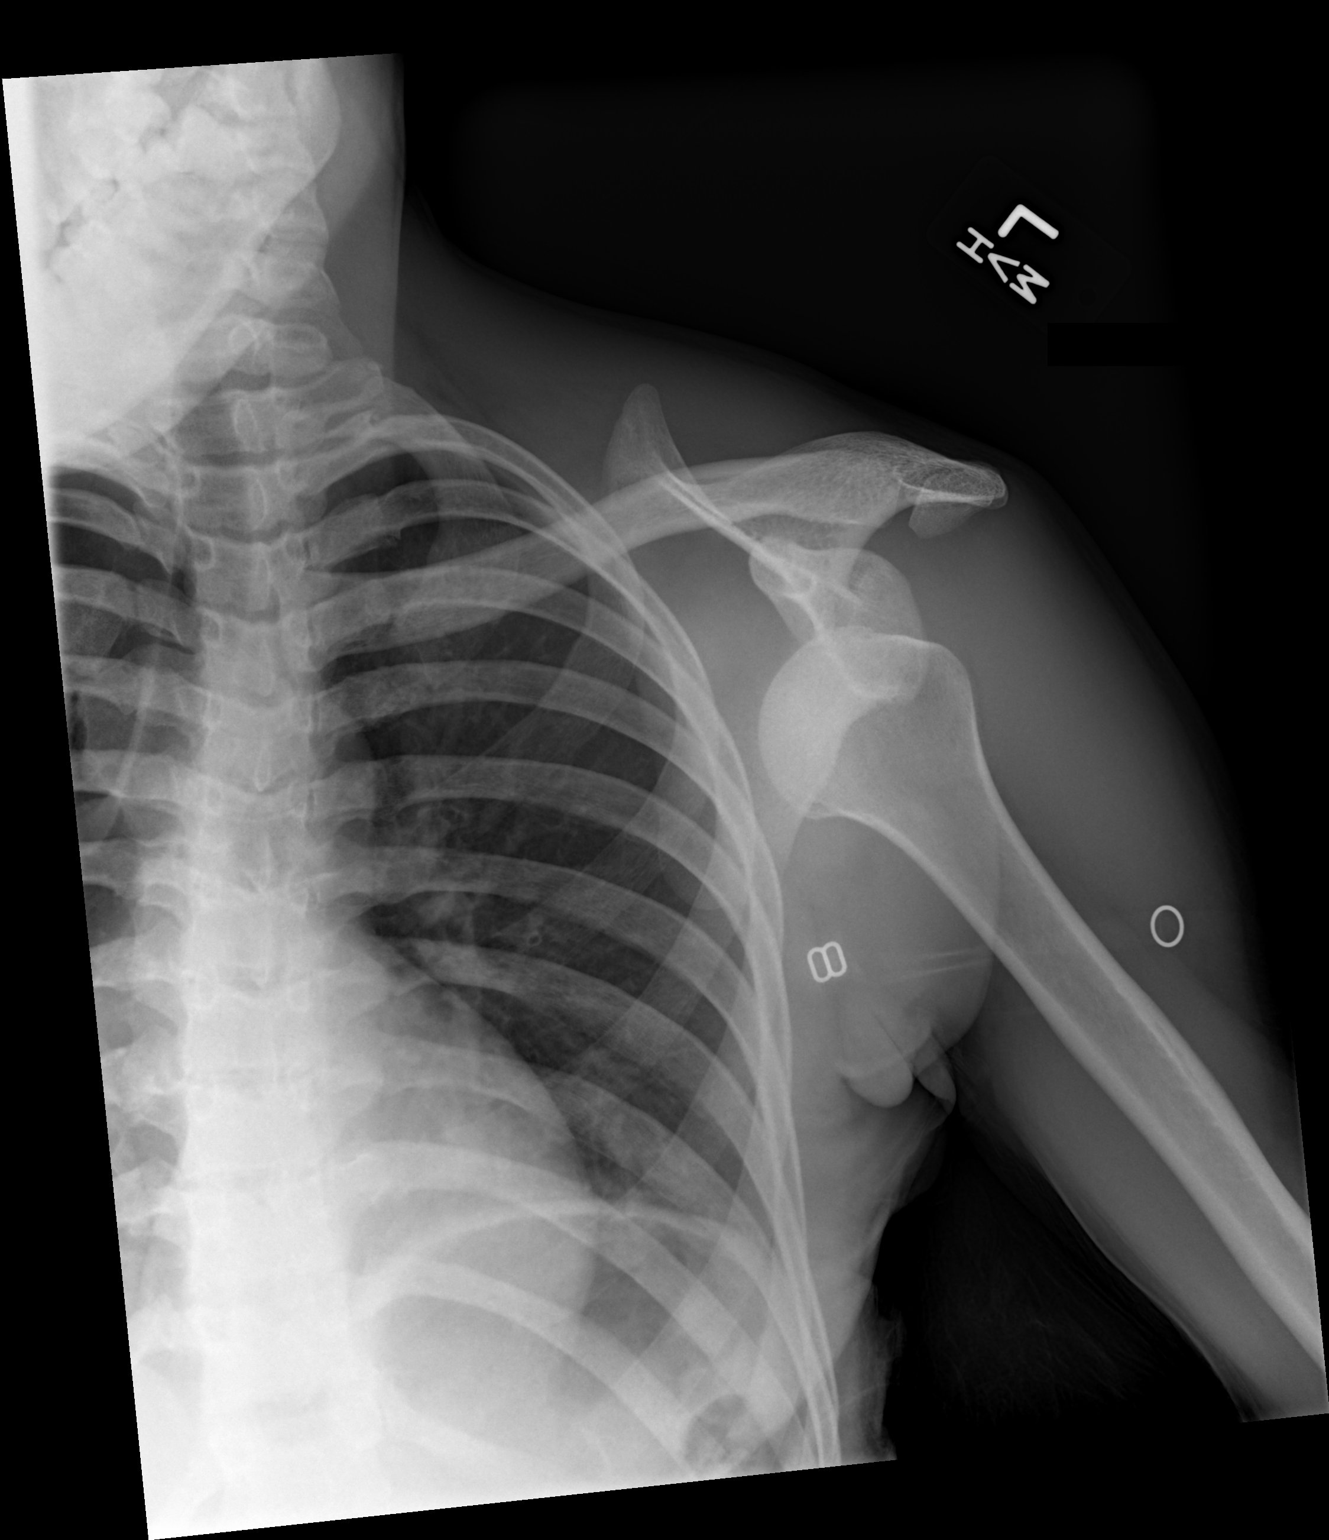
[im 2/2]
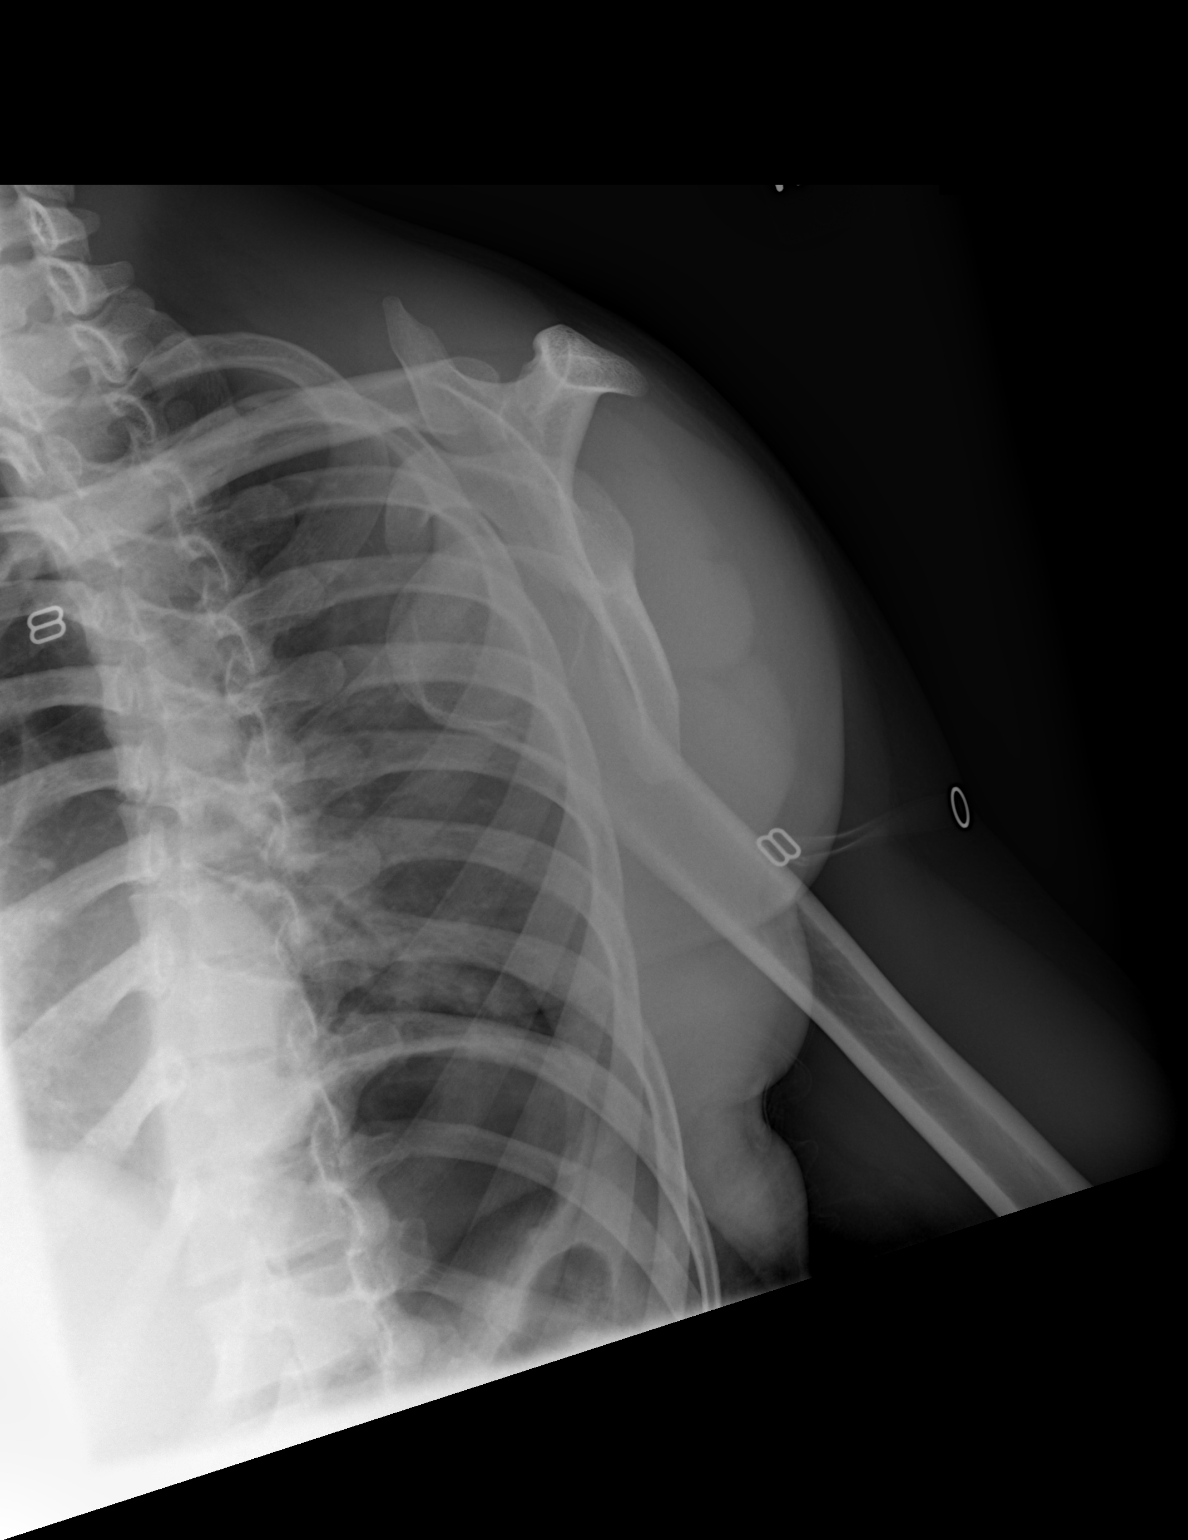

[2 of 2 positions shown; findings below may reference images not displayed]

FINDINGS: Frontal and Y scapular images were obtained. There is a subcoracoid
anterior dislocation. No fracture. No erosive change.
IMPRESSION: Subcoracoid anterior dislocation.

## 2015-06-12 IMAGING — CR DG SHOULDER 1V*L*
1 series · 2 of 2 positions shown · non-contrast
Comparison: Study obtained earlier in the day

CLINICAL DATA: Postreduction from anterior dislocation

EXAM:
LEFT SHOULDER - 2 VIEW

[Series 1: ap int/ext rotation · left · 2 of 2 slices shown]
[im 1/2]
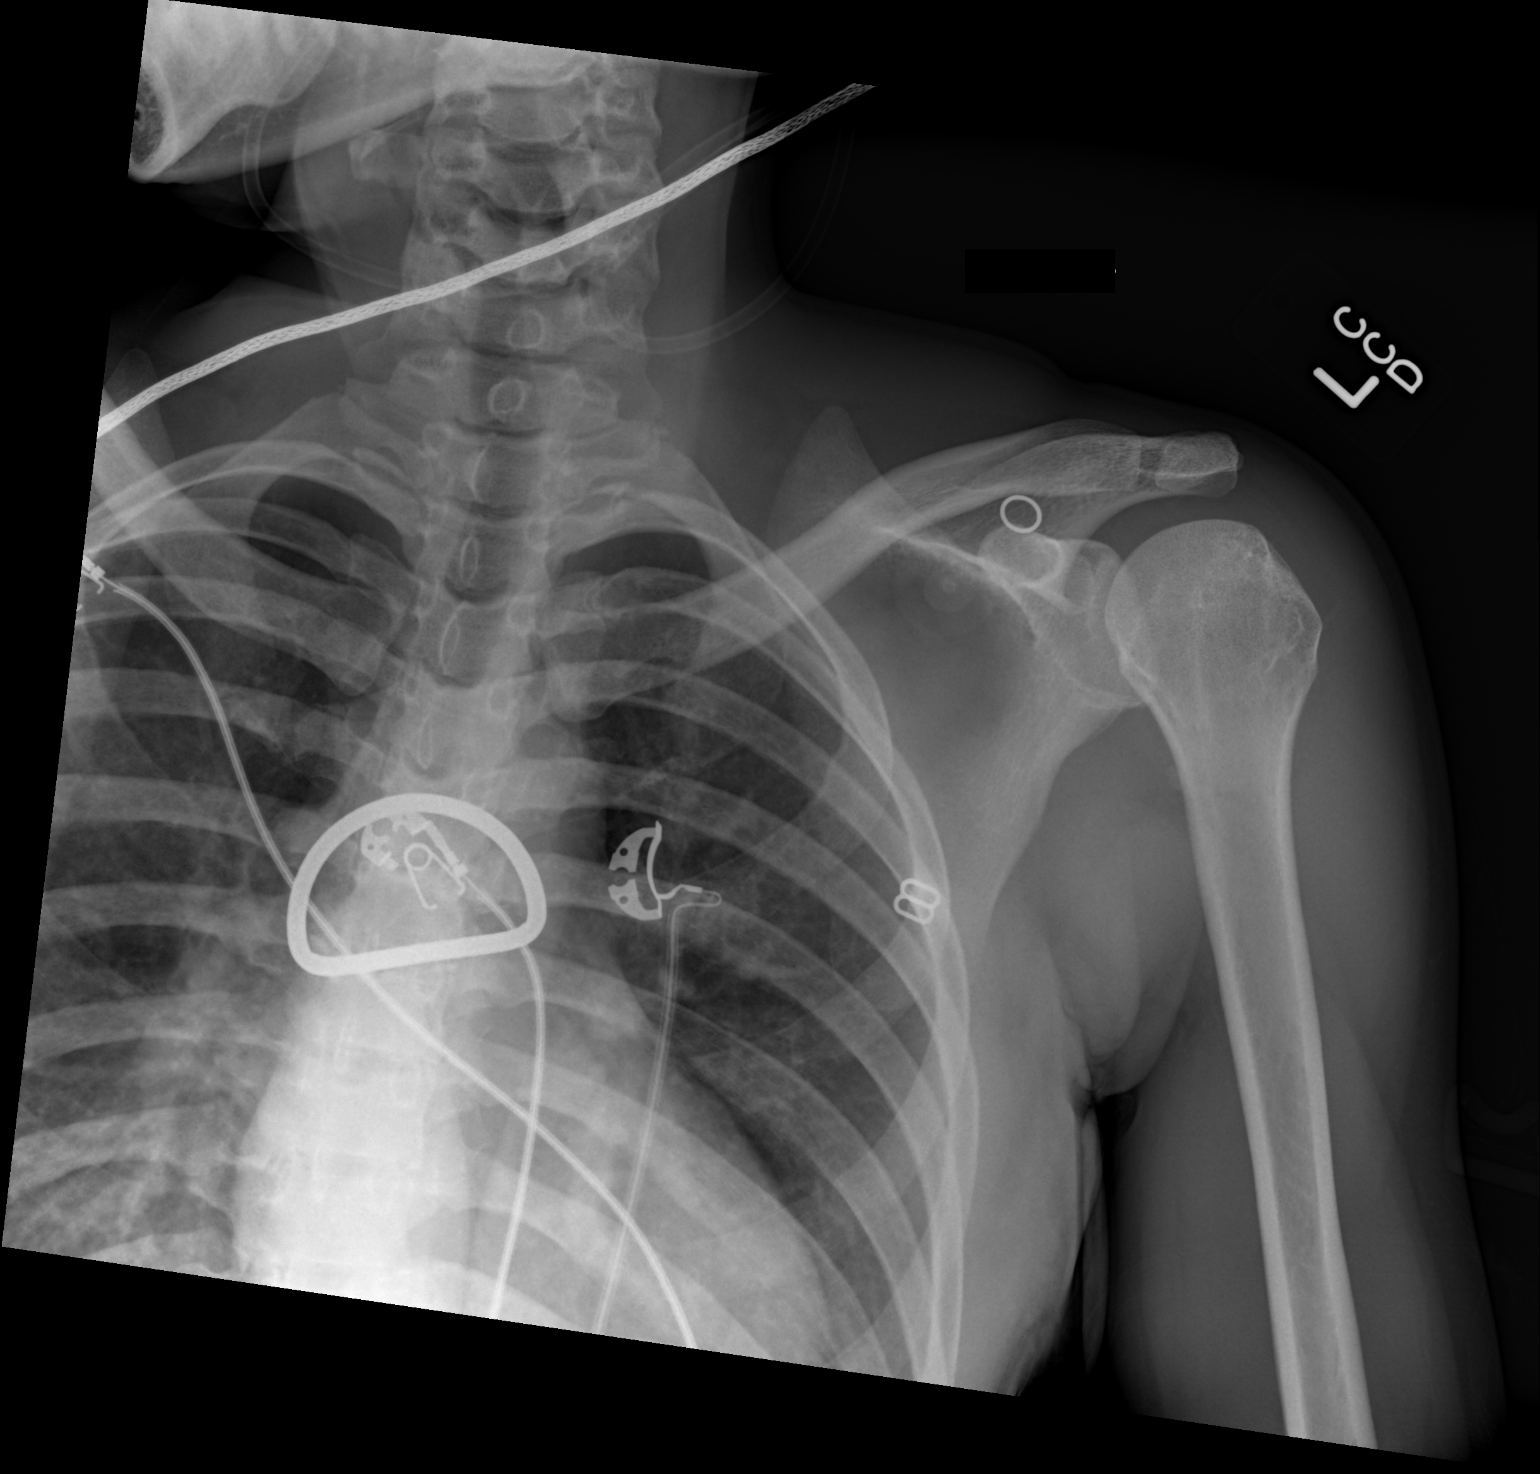
[im 2/2]
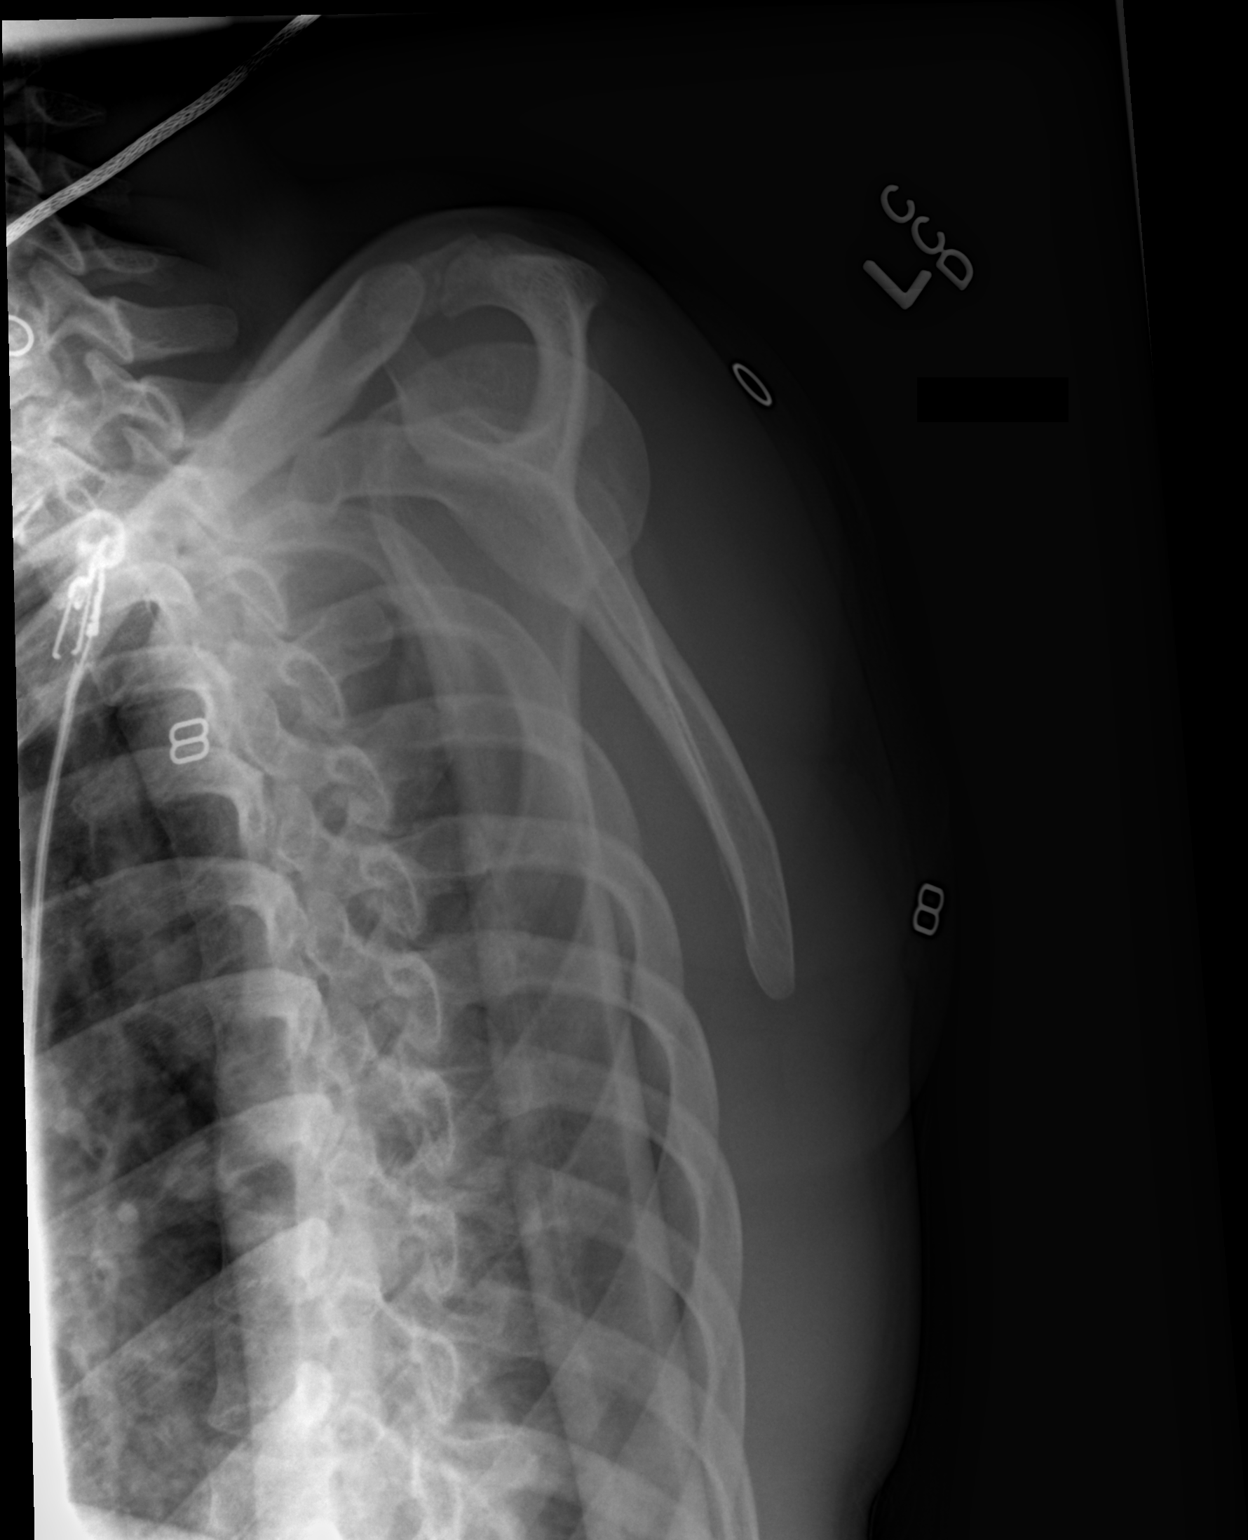

[2 of 2 positions shown; findings below may reference images not displayed]

FINDINGS: Frontal and Y scapular images were obtained. The previously noted
subcoracoid anterior dislocation has been reduced successfully. No
fracture. No appreciable arthropathy.
IMPRESSION: Successful reduction of anterior dislocation. No fracture or
dislocation currently. No appreciable arthropathic change.

## 2015-09-23 NOTE — L&D Delivery Note (Signed)
Delivery Note  30 y/o now G6P4 who presented in spontaneous labor.  At  a viable female was delivered via  (Presentation: ROA ;  ).  APGAR: 8 and 9  Placenta status: intact with 3 vessels  Anesthesia:  epidural Episiotomy:  none Lacerations:  b/l labia Suture Repair: none Est. Blood Loss (mL):  150  Mom to postpartum.  Baby to Couplet care / Skin to Skin.  Ernestina Pennaicholas Dorella Laster 05/06/2016, 1:46 AM

## 2015-10-09 ENCOUNTER — Encounter (HOSPITAL_COMMUNITY): Payer: Self-pay | Admitting: *Deleted

## 2015-10-09 ENCOUNTER — Inpatient Hospital Stay (HOSPITAL_COMMUNITY)
Admission: AD | Admit: 2015-10-09 | Discharge: 2015-10-09 | Disposition: A | Discharge status: Home / Self Care | Payer: Medicaid Other | Source: Ambulatory Visit | Attending: Family Medicine | Admitting: Family Medicine

## 2015-10-09 DIAGNOSIS — O99331 Smoking (tobacco) complicating pregnancy, first trimester: Secondary | ICD-10-CM | POA: Insufficient documentation

## 2015-10-09 DIAGNOSIS — O26891 Other specified pregnancy related conditions, first trimester: Secondary | ICD-10-CM | POA: Insufficient documentation

## 2015-10-09 DIAGNOSIS — F329 Major depressive disorder, single episode, unspecified: Secondary | ICD-10-CM | POA: Diagnosis not present

## 2015-10-09 DIAGNOSIS — R109 Unspecified abdominal pain: Secondary | ICD-10-CM | POA: Diagnosis present

## 2015-10-09 DIAGNOSIS — O9989 Other specified diseases and conditions complicating pregnancy, childbirth and the puerperium: Secondary | ICD-10-CM

## 2015-10-09 DIAGNOSIS — O26899 Other specified pregnancy related conditions, unspecified trimester: Secondary | ICD-10-CM

## 2015-10-09 DIAGNOSIS — H7292 Unspecified perforation of tympanic membrane, left ear: Secondary | ICD-10-CM | POA: Insufficient documentation

## 2015-10-09 DIAGNOSIS — Z3A01 Less than 8 weeks gestation of pregnancy: Secondary | ICD-10-CM | POA: Insufficient documentation

## 2015-10-09 DIAGNOSIS — Z3687 Encounter for antenatal screening for uncertain dates: Secondary | ICD-10-CM

## 2015-10-09 LAB — URINALYSIS, ROUTINE W REFLEX MICROSCOPIC
Bilirubin Urine: NEGATIVE
GLUCOSE, UA: NEGATIVE mg/dL
Hgb urine dipstick: NEGATIVE
KETONES UR: NEGATIVE mg/dL
LEUKOCYTES UA: NEGATIVE
NITRITE: NEGATIVE
Protein, ur: NEGATIVE mg/dL
Specific Gravity, Urine: 1.015 (ref 1.005–1.030)
pH: 7 (ref 5.0–8.0)

## 2015-10-09 LAB — OB RESULTS CONSOLE GC/CHLAMYDIA: Gonorrhea: NEGATIVE

## 2015-10-09 LAB — POCT PREGNANCY, URINE: Preg Test, Ur: POSITIVE — AB

## 2015-10-09 LAB — WET PREP, GENITAL
Sperm: NONE SEEN
Trich, Wet Prep: NONE SEEN
Yeast Wet Prep HPF POC: NONE SEEN

## 2015-10-09 NOTE — MAU Provider Note (Signed)
  History     CSN: [id number]  Arrival date and time: 10/09/15 1102   First Provider Initiated Contact with Patient 10/09/15 1211      Chief Complaint  Patient presents with  . Abdominal Pain  . Otalgia   HPI  Joanna Melton 30 y.o. [id number] @ [redacted]w[redacted]d presents to the mau stating that she is having abdominal pain and she sneezed and felt her left ear pop this morning and is now having ear pain. Denies vaginal bleeding.  Past Medical History  Diagnosis Date  . Chlamydia   . Medical history non-contributory   . Anemia   . Anxiety   . Depression   . Chronic kidney disease     kidney infection     Past Surgical History  Procedure Laterality Date  . Induced abortion      Family History  Problem Relation Age of Onset  . Hypertension Maternal Grandfather   . Diabetes Maternal Grandfather   . Hypertension Mother     Social History  Substance Use Topics  . Smoking status: Current Every Day Smoker -- 0.50 packs/day for 10 years    Types: Cigarettes  . Smokeless tobacco: Never Used  . Alcohol Use: Yes     Comment: occasional    Allergies: No Known Allergies  No prescriptions prior to admission    Review of Systems  Constitutional: Negative for fever.  HENT: Positive for ear pain.   Gastrointestinal: Positive for abdominal pain.  All other systems reviewed and are negative.  Physical Exam   Blood pressure 113/57, pulse 67, temperature 98 F (36.7 C), temperature source Oral, resp. rate 16, last menstrual period 08/16/2015, unknown if currently breastfeeding.  Physical Exam  Nursing note and vitals reviewed. Constitutional: She is oriented to person, place, and time. She appears well-developed and well-nourished. No distress.  HENT:  Head: Normocephalic and atraumatic.  Neck: Normal range of motion.  Cardiovascular: Normal rate.   Respiratory: Effort normal. No respiratory distress.  GI: Soft. There is no tenderness.  Neurological: She is alert and  oriented to person, place, and time.  Skin: Skin is warm and dry.  Psychiatric: She has a normal mood and affect. Her behavior is normal. Judgment and thought content normal.    MAU Course  Procedures  MDM Consulted Dr Ashok Pall to confirm left eardrum perforation. Pt should follow up with PCP in 1 month to recheck eardrum. Pt needs to initate prenatal care. Pt has been scheduled an outpt u/s for dating. Positive fht's  Assessment and Plan  Abdominal pain in pregnancy Left Ear drum perforation Discharge  Bassam Dresch Grissett 10/09/2015, 1:36 PM

## 2015-10-09 NOTE — Discharge Instructions (Signed)
Safe Medications in Pregnancy   Acne: Benzoyl Peroxide Salicylic Acid  Backache/Headache: Tylenol: 2 regular strength every 4 hours OR              2 Extra strength every 6 hours  Colds/Coughs/Allergies: Benadryl (alcohol free) 25 mg every 6 hours as needed Breath right strips Claritin Cepacol throat lozenges Chloraseptic throat spray Cold-Eeze- up to three times per day Cough drops, alcohol free Flonase (by prescription only) Guaifenesin Mucinex Robitussin DM (plain only, alcohol free) Saline nasal spray/drops Sudafed (pseudoephedrine) & Actifed ** use only after [redacted] weeks gestation and if you do not have high blood pressure Tylenol Vicks Vaporub Zinc lozenges Zyrtec   Constipation: Colace Ducolax suppositories Fleet enema Glycerin suppositories Metamucil Milk of magnesia Miralax Senokot Smooth move tea  Diarrhea: Kaopectate Imodium A-D  *NO pepto Bismol  Hemorrhoids: Anusol Anusol HC Preparation H Tucks  Indigestion: Tums Maalox Mylanta Zantac  Pepcid  Insomnia: Benadryl (alcohol free)  every 6 hours as needed Tylenol PM Unisom, no Gelcaps  Leg Cramps: Tums MagGel  Nausea/Vomiting:  Bonine Dramamine Emetrol Ginger extract Sea bands Meclizine  Nausea medication to take during pregnancy:  Unisom (doxylamine succinate 25 mg tablets) Take one tablet daily at bedtime. If symptoms are not adequately controlled, the dose can be increased to a maximum recommended dose of two tablets daily (1/2 tablet in the morning, 1/2 tablet mid-afternoon and one at bedtime). Vitamin B6  tablets. Take one tablet twice a day (up to 200 mg per day).  Skin Rashes: Aveeno products Benadryl cream or  every 6 hours as needed Calamine Lotion 1% cortisone cream  Yeast infection: Gyne-lotrimin 7 Monistat 7   **If taking multiple medications, please check labels to avoid duplicating the same active ingredients **take medication as directed on  the label ** Do not exceed 4000 mg of tylenol in 24 hours **Do not take medications that contain aspirin or ibuprofen   Safe Medications in Pregnancy   Acne: Benzoyl Peroxide Salicylic Acid  Backache/Headache: Tylenol: 2 regular strength every 4 hours OR              2 Extra strength every 6 hours  Colds/Coughs/Allergies: Benadryl (alcohol free) 25 mg every 6 hours as needed Breath right strips Claritin Cepacol throat lozenges Chloraseptic throat spray Cold-Eeze- up to three times per day Cough drops, alcohol free Flonase (by prescription only) Guaifenesin Mucinex Robitussin DM (plain only, alcohol free) Saline nasal spray/drops Sudafed (pseudoephedrine) & Actifed ** use only after [redacted] weeks gestation and if you do not have high blood pressure Tylenol Vicks Vaporub Zinc lozenges Zyrtec   Constipation: Colace Ducolax suppositories Fleet enema Glycerin suppositories Metamucil Milk of magnesia Miralax Senokot Smooth move tea  Diarrhea: Kaopectate Imodium A-D  *NO pepto Bismol  Hemorrhoids: Anusol Anusol HC Preparation H Tucks  Indigestion: Tums Maalox Mylanta Zantac  Pepcid  Insomnia: Benadryl (alcohol free)  every 6 hours as needed Tylenol PM Unisom, no Gelcaps  Leg Cramps: Tums MagGel  Nausea/Vomiting:  Bonine Dramamine Emetrol Ginger extract Sea bands Meclizine  Nausea medication to take during pregnancy:  Unisom (doxylamine succinate 25 mg tablets) Take one tablet daily at bedtime. If symptoms are not adequately controlled, the dose can be increased to a maximum recommended dose of two tablets daily (1/2 tablet in the morning, 1/2 tablet mid-afternoon and one at bedtime). Vitamin B6  tablets. Take one tablet twice a day (up to 200 mg per day).  Skin Rashes: Aveeno products Benadryl cream or  every 6 hours  as needed Calamine Lotion 1% cortisone cream  Yeast infection: Gyne-lotrimin 7 Monistat 7   **If taking  multiple medications, please check labels to avoid duplicating the same active ingredients **take medication as directed on the label ** Do not exceed 4000 mg of tylenol in 24 hours **Do not take medications that contain aspirin or ibuprofen   Safe Medications in Pregnancy   Acne: Benzoyl Peroxide Salicylic Acid  Backache/Headache: Tylenol: 2 regular strength every 4 hours OR              2 Extra strength every 6 hours  Colds/Coughs/Allergies: Benadryl (alcohol free) 25 mg every 6 hours as needed Breath right strips Claritin Cepacol throat lozenges Chloraseptic throat spray Cold-Eeze- up to three times per day Cough drops, alcohol free Flonase (by prescription only) Guaifenesin Mucinex Robitussin DM (plain only, alcohol free) Saline nasal spray/drops Sudafed (pseudoephedrine) & Actifed ** use only after [redacted] weeks gestation and if you do not have high blood pressure Tylenol Vicks Vaporub Zinc lozenges Zyrtec   Constipation: Colace Ducolax suppositories Fleet enema Glycerin suppositories Metamucil Milk of magnesia Miralax Senokot Smooth move tea  Diarrhea: Kaopectate Imodium A-D  *NO pepto Bismol  Hemorrhoids: Anusol Anusol HC Preparation H Tucks  Indigestion: Tums Maalox Mylanta Zantac  Pepcid  Insomnia: Benadryl (alcohol free)  every 6 hours as needed Tylenol PM Unisom, no Gelcaps  Leg Cramps: Tums MagGel  Nausea/Vomiting:  Bonine Dramamine Emetrol Ginger extract Sea bands Meclizine  Nausea medication to take during pregnancy:  Unisom (doxylamine succinate 25 mg tablets) Take one tablet daily at bedtime. If symptoms are not adequately controlled, the dose can be increased to a maximum recommended dose of two tablets daily (1/2 tablet in the morning, 1/2 tablet mid-afternoon and one at bedtime). Vitamin B6  tablets. Take one tablet twice a day (up to 200 mg per day).  Skin Rashes: Aveeno products Benadryl cream or   every 6 hours as needed Calamine Lotion 1% cortisone cream  Yeast infection: Gyne-lotrimin 7 Monistat 7   **If taking multiple medications, please check labels to avoid duplicating the same active ingredients **take medication as directed on the label ** Do not exceed 4000 mg of tylenol in 24 hours **Do not take medications that contain aspirin or ibuprofen   Eardrum Perforation An eardrum perforation is a puncture or tear in the eardrum. This is also called a ruptured eardrum. The eardrum is a thin, round tissue inside of your ear that separates your ear canal from your middle ear. This is the tissue that detects sound and enables you to hear. An eardrum perforation can cause discomfort and hearing loss. In most cases, the eardrum will heal without treatment and with little or no permanent hearing loss. CAUSES An eardrum perforation can result from different causes, including:  Sudden pressure changes that happen in situations such as scuba diving or flying in an airplane.  Foreign objects in the ear.  Inserting a cotton-tipped swab or any blunt object into the ear.  Loud noise.  Trauma to the ear.  Attempting to remove an object from the ear. SIGNS AND SYMPTOMS  Hearing loss.  Ear pain.  Ringing in the ear.  Discharge or bleeding from the ear.  Dizziness.  Vomiting.  Facial paralysis. DIAGNOSIS  Your health care provider will examine your ear using a tool called an otoscope. This tool allows the health care provider to see into your ear to examine your eardrum. Various types of hearing tests may also be done. TREATMENT  Typically, the eardrum will heal on its own within a few weeks. If your eardrum does not heal, your health care provider may recommend one of the following treatments:  A procedure to place a patch over your eardrum.  Surgery to repair your eardrum. HOME CARE INSTRUCTIONS   Keep your ear dry. This will improve healing. Do not submerge your  head under water until healing is complete. Do not swim or dive until your health care provider approves. While bathing or showering, protect your ear using one of these methods:  Using a waterproof earplug.  Covering a piece of cotton with petroleum jelly and placing it in your outer ear canal.  Take medicines only as directed by your health care provider.  Avoid blowing your nose if possible. If you blow your nose, do it gently. Forceful blowing increases the pressure in your middle ear. This may cause further injury or may delay your healing.  Resume your normal activities after the perforation has healed. Your health care provider can tell you when this has occurred.  Talk to your health care provider before you fly on an airplane. Air travel is generally allowed with a perforated eardrum.  Keep all follow-up visits as directed by your health care provider. This is important. SEEK MEDICAL CARE IF:  You have a fever. SEEK IMMEDIATE MEDICAL CARE IF:  You have blood or pus coming from your ear.  You have dizziness or problems with balance.  You have nausea or vomiting.  You have increased pain.   This information is not intended to replace advice given to you by your health care provider. Make sure you discuss any questions you have with your health care provider.   Document Released: 09/05/2000 Document Revised: 09/29/2014 Document Reviewed: 04/17/2014 Elsevier Interactive Patient Education [employer]

## 2015-10-09 NOTE — MAU Note (Signed)
Pt C/O lower abd pain x 2 weeks, late for period, doesn't know if she is pregnant.  Has vaginal discharge, wants to be checked for STD's.  Developed ear ache this morning when she blew her nose.

## 2015-10-10 LAB — GC/CHLAMYDIA PROBE AMP (~~LOC~~) NOT AT ARMC
Chlamydia: NEGATIVE
Neisseria Gonorrhea: NEGATIVE

## 2015-10-20 ENCOUNTER — Inpatient Hospital Stay (HOSPITAL_COMMUNITY): Payer: Medicaid Other

## 2015-10-20 ENCOUNTER — Encounter (HOSPITAL_COMMUNITY): Payer: Self-pay | Admitting: *Deleted

## 2015-10-20 ENCOUNTER — Inpatient Hospital Stay (HOSPITAL_COMMUNITY)
Admission: AD | Admit: 2015-10-20 | Discharge: 2015-10-20 | Disposition: A | Discharge status: Home / Self Care | Payer: Medicaid Other | Source: Ambulatory Visit | Attending: Obstetrics & Gynecology | Admitting: Obstetrics & Gynecology

## 2015-10-20 DIAGNOSIS — F17210 Nicotine dependence, cigarettes, uncomplicated: Secondary | ICD-10-CM | POA: Diagnosis not present

## 2015-10-20 DIAGNOSIS — O99331 Smoking (tobacco) complicating pregnancy, first trimester: Secondary | ICD-10-CM | POA: Diagnosis not present

## 2015-10-20 DIAGNOSIS — O9989 Other specified diseases and conditions complicating pregnancy, childbirth and the puerperium: Secondary | ICD-10-CM

## 2015-10-20 DIAGNOSIS — O26891 Other specified pregnancy related conditions, first trimester: Secondary | ICD-10-CM | POA: Insufficient documentation

## 2015-10-20 DIAGNOSIS — R1030 Lower abdominal pain, unspecified: Secondary | ICD-10-CM | POA: Diagnosis present

## 2015-10-20 DIAGNOSIS — Z3A09 9 weeks gestation of pregnancy: Secondary | ICD-10-CM | POA: Diagnosis not present

## 2015-10-20 DIAGNOSIS — R109 Unspecified abdominal pain: Secondary | ICD-10-CM

## 2015-10-20 DIAGNOSIS — O26899 Other specified pregnancy related conditions, unspecified trimester: Secondary | ICD-10-CM

## 2015-10-20 LAB — CBC
HCT: 31.4 % — ABNORMAL LOW (ref 36.0–46.0)
HEMOGLOBIN: 10.6 g/dL — AB (ref 12.0–15.0)
MCH: 29.5 pg (ref 26.0–34.0)
MCHC: 33.8 g/dL (ref 30.0–36.0)
MCV: 87.5 fL (ref 78.0–100.0)
PLATELETS: 298 10*3/uL (ref 150–400)
RBC: 3.59 MIL/uL — AB (ref 3.87–5.11)
RDW: 13.4 % (ref 11.5–15.5)
WBC: 6.9 10*3/uL (ref 4.0–10.5)

## 2015-10-20 LAB — URINE MICROSCOPIC-ADD ON

## 2015-10-20 LAB — URINALYSIS, ROUTINE W REFLEX MICROSCOPIC
BILIRUBIN URINE: NEGATIVE
Glucose, UA: NEGATIVE mg/dL
KETONES UR: NEGATIVE mg/dL
LEUKOCYTES UA: NEGATIVE
Nitrite: NEGATIVE
Protein, ur: NEGATIVE mg/dL
SPECIFIC GRAVITY, URINE: 1.02 (ref 1.005–1.030)
pH: 6.5 (ref 5.0–8.0)

## 2015-10-20 LAB — HCG, QUANTITATIVE, PREGNANCY: HCG, BETA CHAIN, QUANT, S: 75689 m[IU]/mL — AB (ref <5)

## 2015-10-20 MED ORDER — PRENATAL COMPLETE 14-0.4 MG PO TABS: 1.0000 | ORAL_TABLET | Freq: Every day | ORAL | Status: DC

## 2015-10-20 NOTE — Discharge Instructions (Signed)

## 2015-10-20 NOTE — MAU Provider Note (Signed)
History     CSN: [id number]  Arrival date and time: 10/20/15 1045   First Provider Initiated Contact with Patient 10/20/15 1131      Chief Complaint  Patient presents with  . Abdominal Pain  . Vaginal Bleeding   HPI  [name] is a 30 y.o. female [phone] at [redacted]w[redacted]d presenting to MAU with concerns of lower abdominal pain located in the center of her abdomen. The pain started this morning. She has not taking anything for the pain.   She was seen on 1/17 for the same complaint and had to leave before her [place] was completed. She had vaginal cultures done at that time.   She denies vaginal bleeding.    OB History    Gravida Para Term Preterm AB TAB SAB Ectopic Multiple Living   0 1 0 1 0 0 3      Past Medical History  Diagnosis Date  . Chlamydia   . Medical history non-contributory   . Anemia   . Anxiety   . Depression     Past Surgical History  Procedure Laterality Date  . Induced abortion      Family History  Problem Relation Age of Onset  . Hypertension Maternal Grandfather   . Diabetes Maternal Grandfather   . Hypertension Mother     Social History  Substance Use Topics  . Smoking status: Current Every Day Smoker -- 0.50 packs/day for 10 years    Types: Cigarettes  . Smokeless tobacco: Never Used  . Alcohol Use: Yes     Comment: occasional    Allergies: No Known Allergies  Prescriptions prior to admission  Medication Sig Dispense Refill Last Dose  . Prenatal Vit-Fe Fumarate-FA (PRENATAL COMPLETE) 14-0.4 MG TABS Take 1 tablet by mouth daily. (Patient not taking: Reported on 03/27/2015) 60 each 6 Not Taking at Unknown time   Results for orders placed or performed during the hospital encounter of 10/20/15 (from the past 48 hour(s))  Urinalysis, Routine w reflex microscopic (not at [facility])     Status: Abnormal   Collection Time: 10/20/15 11:05 AM  Result Value Ref Range   Color, Urine YELLOW YELLOW   APPearance CLEAR CLEAR   Specific  Gravity, Urine 1.020 1.005 - 1.030   pH 6.5 5.0 - 8.0   Glucose, UA NEGATIVE NEGATIVE mg/dL   Hgb urine dipstick TRACE (A) NEGATIVE   Bilirubin Urine NEGATIVE NEGATIVE   Ketones, ur NEGATIVE NEGATIVE mg/dL   Protein, ur NEGATIVE NEGATIVE mg/dL   Nitrite NEGATIVE NEGATIVE   Leukocytes, UA NEGATIVE NEGATIVE  Urine microscopic-add on     Status: Abnormal   Collection Time: 10/20/15 11:05 AM  Result Value Ref Range   Squamous Epithelial / LPF 0-5 (A) NONE SEEN   WBC, UA 0-5 0 - 5 WBC/hpf   RBC / HPF 0-5 0 - 5 RBC/hpf   Bacteria, UA FEW (A) NONE SEEN  CBC     Status: Abnormal   Collection Time: 10/20/15 11:43 AM  Result Value Ref Range   WBC 6.9 4.0 - 10.5 K/uL   RBC 3.59 (L) 3.87 - 5.11 MIL/uL   Hemoglobin 10.6 (L) 12.0 - 15.0 g/dL   HCT 14.7 (L) 82.9 - 56.2 %   MCV 87.5 78.0 - 100.0 fL   MCH 29.5 26.0 - 34.0 pg   MCHC 33.8 30.0 - 36.0 g/dL   RDW 13.0 86.5 - 78.4 %   Platelets 298 150 - 400 K/uL  hCG, quantitative, pregnancy  Status: Abnormal   Collection Time: 10/20/15 11:43 AM  Result Value Ref Range   hCG, Beta Chain, Quant, S 75689 (H) <5 mIU/mL    Comment:          GEST. AGE      CONC.  (mIU/mL)   <=1 WEEK        5 - 50     2 WEEKS       50 - 500     3 WEEKS       100 - 10,000     4 WEEKS     1,000 - 30,000     5 WEEKS     3,500 - 115,000   6-8 WEEKS     12,000 - 270,000    12 WEEKS     15,000 - 220,000        FEMALE AND NON-PREGNANT FEMALE:     LESS THAN 5 mIU/mL    US Ob Comp Less 14 Wks  10/20/2015  EXAM: OBSTETRIC <14 WK ULTRASOUND TECHNIQUE: Transabdominal ultrasound was performed for evaluation of the gestation as well as the maternal uterus and adnexal regions. COMPARISON:  None. FINDINGS: Intrauterine gestational sac: Visualized/normal in shape. Yolk sac:  Not visualized. Embryo:  Visualized. Cardiac Activity: Visualized. Heart Rate: 136 bpm CRL:   50.5  mm   11 w 5 d                  [place] EDC: 05/05/2016. Subchorionic hemorrhage:  None visualized. Maternal  uterus/adnexae: Maternal ovaries are unremarkable. IMPRESSION: Single living intrauterine gestation at estimated 11 week 5 day gestational age by crown-rump length. Electronically Signed   By: Kennith Center M.D.   On: 10/20/2015 12:17    Review of Systems  Constitutional: Negative for fever and chills.  Gastrointestinal: Positive for abdominal pain. Negative for nausea and vomiting.  Genitourinary: Negative for dysuria and urgency.   Physical Exam   Blood pressure 102/52, pulse 71, temperature 97.8 F (36.6 C), temperature source Oral, resp. rate 18, last menstrual period 08/16/2015, unknown if currently breastfeeding.  Physical Exam  Constitutional: She is oriented to person, place, and time. She appears well-developed and well-nourished. No distress.  HENT:  Head: Normocephalic.  Eyes: Pupils are equal, round, and reactive to light.  Neck: Neck supple.  GI: Soft. She exhibits no distension and no mass. There is no tenderness. There is no rebound and no guarding.  Musculoskeletal: Normal range of motion.  Neurological: She is alert and oriented to person, place, and time.  Skin: Skin is warm. She is not diaphoretic.  Psychiatric: Her behavior is normal.    MAU Course  Procedures  None  MDM   Assessment and Plan   A:  1. Abdominal pain in pregnancy, antepartum   2. Abdominal pain in pregnancy, antepartum    P:  Discharge home in stable condition Start prenatal care ASAP RX: prenatal vitamins Return to MAU if symptoms worsen   Duane Lope, NP 10/20/2015 2:14 PM

## 2015-10-20 NOTE — MAU Note (Addendum)
C/o spotting and abdominal pain that started this morning around 0845; G6P3; 9weeks2 days gestation;  Seen in MAU on 10/09/15 and had to leave before her u/s was done; desires to have u/s done today;

## 2015-10-21 LAB — HIV ANTIBODY (ROUTINE TESTING W REFLEX): HIV Screen 4th Generation wRfx: NONREACTIVE

## 2015-10-31 ENCOUNTER — Encounter (HOSPITAL_COMMUNITY): Payer: Self-pay | Admitting: Emergency Medicine

## 2015-10-31 ENCOUNTER — Emergency Department (HOSPITAL_COMMUNITY)
Admission: EM | Admit: 2015-10-31 | Discharge: 2015-10-31 | Disposition: A | Discharge status: Home / Self Care | Payer: Medicaid Other | Attending: Emergency Medicine | Admitting: Emergency Medicine

## 2015-10-31 DIAGNOSIS — Z8619 Personal history of other infectious and parasitic diseases: Secondary | ICD-10-CM | POA: Diagnosis not present

## 2015-10-31 DIAGNOSIS — O99611 Diseases of the digestive system complicating pregnancy, first trimester: Secondary | ICD-10-CM | POA: Insufficient documentation

## 2015-10-31 DIAGNOSIS — K029 Dental caries, unspecified: Secondary | ICD-10-CM | POA: Insufficient documentation

## 2015-10-31 DIAGNOSIS — K0889 Other specified disorders of teeth and supporting structures: Secondary | ICD-10-CM | POA: Diagnosis not present

## 2015-10-31 DIAGNOSIS — O9989 Other specified diseases and conditions complicating pregnancy, childbirth and the puerperium: Secondary | ICD-10-CM | POA: Insufficient documentation

## 2015-10-31 DIAGNOSIS — Z8659 Personal history of other mental and behavioral disorders: Secondary | ICD-10-CM | POA: Diagnosis not present

## 2015-10-31 DIAGNOSIS — Z79899 Other long term (current) drug therapy: Secondary | ICD-10-CM | POA: Insufficient documentation

## 2015-10-31 DIAGNOSIS — Z3A13 13 weeks gestation of pregnancy: Secondary | ICD-10-CM | POA: Insufficient documentation

## 2015-10-31 DIAGNOSIS — O99331 Smoking (tobacco) complicating pregnancy, first trimester: Secondary | ICD-10-CM | POA: Diagnosis not present

## 2015-10-31 DIAGNOSIS — O99011 Anemia complicating pregnancy, first trimester: Secondary | ICD-10-CM | POA: Diagnosis not present

## 2015-10-31 DIAGNOSIS — D649 Anemia, unspecified: Secondary | ICD-10-CM | POA: Insufficient documentation

## 2015-10-31 DIAGNOSIS — F17210 Nicotine dependence, cigarettes, uncomplicated: Secondary | ICD-10-CM | POA: Insufficient documentation

## 2015-10-31 MED ORDER — AMOXICILLIN 500 MG PO CAPS: 500.0000 mg | ORAL_CAPSULE | Freq: Three times a day (TID) | ORAL | Status: DC

## 2015-10-31 NOTE — ED Notes (Addendum)
Patient states R side lower dental pain.   Patient states she tried to get into dentist and couldn't get in.  Patient states hasn't taken anything at home for the pain.

## 2015-10-31 NOTE — ED Provider Notes (Signed)
CSN: [id number]     Arrival date & time 10/31/15  4098 History  By signing my name below, I, Joanna Melton, attest that this documentation has been prepared under the direction and in the presence of Roxy Horseman, PA-C Electronically Signed: Soijett Melton, ED Scribe. 10/31/2015. 10:01 AM.   Chief Complaint  Patient presents with  . Dental Pain      The history is provided by the patient. No language interpreter was used.   Joanna Melton is a 30 y.o. female who presents to the Emergency Department complaining of constant right lower dental pain onset 3 AM this morning. She reports that her right lower dental pain radiates to her right sided jaw. She notes that she doesn't have a dentist. Pt is currently [redacted] weeks pregnant. She states that she has not tried any medications for the relief for her symptoms. She denies fever, and any other symptoms. Pt denies allergies to any medications.    Past Medical History  Diagnosis Date  . Chlamydia   . Medical history non-contributory   . Anemia   . Anxiety   . Depression    Past Surgical History  Procedure Laterality Date  . Induced abortion     Family History  Problem Relation Age of Onset  . Hypertension Maternal Grandfather   . Diabetes Maternal Grandfather   . Hypertension Mother    Social History  Substance Use Topics  . Smoking status: Current Every Day Smoker -- 0.50 packs/day for 10 years    Types: Cigarettes  . Smokeless tobacco: Never Used  . Alcohol Use: Yes     Comment: occasional   OB History    Gravida Para Term Preterm AB TAB SAB Ectopic Multiple Living   0 1 0 1 0 0 3     Review of Systems  Constitutional: Negative for fever.  HENT: Positive for dental problem.   Skin: Negative for color change.      Allergies  Review of patient's allergies indicates no known allergies.  Home Medications   Prior to Admission medications   Medication Sig Start Date End Date Taking? Authorizing Provider   Prenatal Vit-Fe Fumarate-FA (PRENATAL COMPLETE) 14-0.4 MG TABS Take 1 tablet by mouth daily. 10/20/15   Duane Lope, NP   LMP 08/16/2015 (LMP Unknown) Physical Exam  Physical Exam  Constitutional: Pt appears well-developed and well-nourished.  HENT:  Head: Normocephalic.  Right Ear: Tympanic membrane, external ear and ear canal normal.  Left Ear: Tympanic membrane, external ear and ear canal normal.  Nose: Nose normal. Right sinus exhibits no maxillary sinus tenderness and no frontal sinus tenderness. Left sinus exhibits no maxillary sinus tenderness and no frontal sinus tenderness.  Mouth/Throat: Uvula is midline, oropharynx is clear and moist and mucous membranes are normal. No oral lesions. Abnormal dentition. Dental caries present. No uvula swelling or lacerations. No oropharyngeal exudate, posterior oropharyngeal edema, posterior oropharyngeal erythema or tonsillar abscesses.  No gingival swelling, fluctuance or induration No gross abscess  Eyes: Conjunctivae are normal. Pupils are equal, round, and reactive to light. Right eye exhibits no discharge. Left eye exhibits no discharge.  Neck: Normal range of motion. Neck supple.  No stridor Handling secretions without difficulty No nuchal rigidity No cervical lymphadenopathy   Cardiovascular: Normal rate, regular rhythm and normal heart sounds.   Pulmonary/Chest: Effort normal. No respiratory distress.  Equal chest rise  Abdominal: Soft. Bowel sounds are normal. Pt exhibits no distension. There is no tenderness.  Lymphadenopathy:  Pt has no cervical adenopathy.  Neurological: Pt is alert.  Skin: Skin is warm and dry.  Psychiatric: Pt has a normal mood and affect.  Nursing note and vitals reviewed.   ED Course  Procedures (including critical care time) DIAGNOSTIC STUDIES: Oxygen Saturation is 99% on RA, nl by my interpretation.    COORDINATION OF CARE: 10:00 AM Discussed treatment plan with pt at bedside which includes  penicillin Rx and pt agreed to plan.      MDM   Final diagnoses:  Pain, dental    Patient with dentalgia.  No abscess requiring immediate incision and drainage.  Exam not concerning for Ludwig's angina or pharyngeal abscess.  Will treat with amox Rx. Pt instructed to follow-up with dentist.  Discussed return precautions. Pt safe for discharge.    I personally performed the services described in this documentation, which was scribed in my presence. The recorded information has been reviewed and is accurate.     Roxy Horseman, PA-C 10/31/15 1014  Tilden Fossa, MD 11/01/15 [phone]

## 2015-10-31 NOTE — Discharge Instructions (Signed)

## 2015-11-22 ENCOUNTER — Encounter: Payer: Medicaid Other | Admitting: Advanced Practice Midwife

## 2015-12-19 ENCOUNTER — Ambulatory Visit (INDEPENDENT_AMBULATORY_CARE_PROVIDER_SITE_OTHER): Payer: Medicaid Other | Admitting: Advanced Practice Midwife

## 2015-12-19 ENCOUNTER — Other Ambulatory Visit (HOSPITAL_COMMUNITY)
Admission: RE | Admit: 2015-12-19 | Discharge: 2015-12-19 | Disposition: A | Discharge status: Home / Self Care | Payer: Medicaid Other | Source: Ambulatory Visit | Attending: Advanced Practice Midwife | Admitting: Advanced Practice Midwife

## 2015-12-19 ENCOUNTER — Encounter: Payer: Self-pay | Admitting: Advanced Practice Midwife

## 2015-12-19 VITALS — BP 101/50 | HR 74 | Temp 98.0°F | Wt 139.7 lb

## 2015-12-19 DIAGNOSIS — O0932 Supervision of pregnancy with insufficient antenatal care, second trimester: Secondary | ICD-10-CM

## 2015-12-19 DIAGNOSIS — Z01411 Encounter for gynecological examination (general) (routine) with abnormal findings: Secondary | ICD-10-CM | POA: Insufficient documentation

## 2015-12-19 DIAGNOSIS — Z3482 Encounter for supervision of other normal pregnancy, second trimester: Secondary | ICD-10-CM | POA: Diagnosis present

## 2015-12-19 DIAGNOSIS — Z3492 Encounter for supervision of normal pregnancy, unspecified, second trimester: Secondary | ICD-10-CM

## 2015-12-19 DIAGNOSIS — O0933 Supervision of pregnancy with insufficient antenatal care, third trimester: Secondary | ICD-10-CM | POA: Insufficient documentation

## 2015-12-19 DIAGNOSIS — Z124 Encounter for screening for malignant neoplasm of cervix: Secondary | ICD-10-CM | POA: Diagnosis not present

## 2015-12-19 DIAGNOSIS — Z1151 Encounter for screening for human papillomavirus (HPV): Secondary | ICD-10-CM | POA: Diagnosis not present

## 2015-12-19 DIAGNOSIS — Z113 Encounter for screening for infections with a predominantly sexual mode of transmission: Secondary | ICD-10-CM

## 2015-12-19 LAB — POCT URINALYSIS DIP (DEVICE)
Bilirubin Urine: NEGATIVE
Glucose, UA: NEGATIVE mg/dL
Ketones, ur: NEGATIVE mg/dL
Leukocytes, UA: NEGATIVE
NITRITE: NEGATIVE
PH: 7 (ref 5.0–8.0)
Protein, ur: NEGATIVE mg/dL
SPECIFIC GRAVITY, URINE: 1.015 (ref 1.005–1.030)
UROBILINOGEN UA: 2 mg/dL — AB (ref 0.0–1.0)

## 2015-12-19 NOTE — Progress Notes (Signed)
Here for first visit. Given new patient information. Declines flu.

## 2015-12-19 NOTE — Patient Instructions (Signed)

## 2015-12-19 NOTE — Progress Notes (Signed)
New OB. See SmartSet  Subjective:    Joanna Melton is a [id number] being seen today for her first obstetrical visit.  Her obstetrical history is significant for smoker, late care, possible social issues (2014 note states she did not have custody of first 2 kids), anemia. Patient does not intend to breast feed, even thought she breastfed first 3. Pregnancy history fully reviewed.  Patient reports no complaints.  Filed Vitals:   12/19/15 1342  BP: 101/50  Pulse: 74  Temp: 98 F (36.7 C)  Weight: 139 lb 11.2 oz (63.368 kg)    HISTORY: OB History  Gravida Para Term Preterm AB SAB TAB Ectopic Multiple Living  6 3 3  0 2 1 1  0 0 3    # Outcome Date GA Lbr Len/2nd Weight Sex Delivery Anes PTL Lv  6 Current           5 TAB 04/15/15          4 SAB 02/28/14          3 Term 08/14/13 5578w1d 06:25 / 00:31 6 lb 10 oz (3.005 kg) M Vag-Spont EPI  Y  2 Term 05/16/07 4368w0d  5 lb 14 oz (2.665 kg) M Vag-Spont EPI  Y  1 Term 05/22/06 2768w0d  5 lb 9 oz (2.523 kg) F Vag-Spont EPI  Y     Past Medical History  Diagnosis Date  . Chlamydia   . Medical history non-contributory   . Anemia   . Anxiety   . Depression    Past Surgical History  Procedure Laterality Date  . Induced abortion     Family History  Problem Relation Age of Onset  . Hypertension Maternal Grandfather   . Diabetes Maternal Grandfather   . Hypertension Mother      Exam    Uterus:  Fundal Height: 20 cm  Pelvic Exam:    Perineum: No Hemorrhoids, Normal Perineum   Vulva: Bartholin's, Urethra, Skene's normal   Vagina:  normal discharge   pH:    Cervix: multiparous appearance   Adnexa: no mass, fullness, tenderness   Bony Pelvis: gynecoid  System: Breast:  normal appearance, no masses or tenderness   Skin: normal coloration and turgor, no rashes    Neurologic: oriented, grossly non-focal   Extremities: normal strength, tone, and muscle mass   HEENT neck supple with midline trachea   Mouth/Teeth mucous  membranes moist, pharynx normal without lesions   Neck supple and no masses   Cardiovascular: regular rate and rhythm, no murmurs or gallops   Respiratory:  appears well, vitals normal, no respiratory distress, acyanotic, normal RR, ear and throat exam is normal, neck free of mass or lymphadenopathy, chest clear, no wheezing, crepitations, rhonchi, normal symmetric air entry   Abdomen: soft, non-tender; bowel sounds normal; no masses,  no organomegaly   Urinary: urethral meatus normal      Assessment:    Pregnancy: [id number] Patient Active Problem List   Diagnosis Date Noted  . Late prenatal care affecting pregnancy in second trimester, antepartum 12/19/2015  . Anemia complicating pregnancy in third trimester 06/01/2013  . Smoker 05/03/2013        Plan:     Initial labs drawn. Prenatal vitamins. Problem list reviewed and updated. Genetic Screening discussed Quad Screen: ordered.  Ultrasound discussed; fetal survey: ordered.  Follow up in 4 weeks. 50% of 30 min visit spent on counseling and coordination of care.   Routines reviewed Problem list updated.   [facility]endezWILLIAMS,MARIE 12/19/2015

## 2015-12-20 LAB — PRENATAL PROFILE (SOLSTAS)
Antibody Screen: NEGATIVE
BASOS ABS: 0 10*3/uL (ref 0.0–0.1)
Basophils Relative: 0 % (ref 0–1)
Eosinophils Absolute: 0.2 10*3/uL (ref 0.0–0.7)
Eosinophils Relative: 3 % (ref 0–5)
HEMATOCRIT: 28.6 % — AB (ref 36.0–46.0)
HEMOGLOBIN: 9.5 g/dL — AB (ref 12.0–15.0)
HEP B S AG: NEGATIVE
HIV 1&2 Ab, 4th Generation: NONREACTIVE
LYMPHS ABS: 1.7 10*3/uL (ref 0.7–4.0)
Lymphocytes Relative: 24 % (ref 12–46)
MCH: 29.3 pg (ref 26.0–34.0)
MCHC: 33.2 g/dL (ref 30.0–36.0)
MCV: 88.3 fL (ref 78.0–100.0)
MONOS PCT: 7 % (ref 3–12)
MPV: 10.6 fL (ref 8.6–12.4)
Monocytes Absolute: 0.5 10*3/uL (ref 0.1–1.0)
NEUTROS ABS: 4.7 10*3/uL (ref 1.7–7.7)
NEUTROS PCT: 66 % (ref 43–77)
Platelets: 255 10*3/uL (ref 150–400)
RBC: 3.24 MIL/uL — ABNORMAL LOW (ref 3.87–5.11)
RDW: 14.7 % (ref 11.5–15.5)
RUBELLA: 1.91 {index} — AB (ref <0.90)
Rh Type: POSITIVE
WBC: 7.1 10*3/uL (ref 4.0–10.5)

## 2015-12-20 LAB — CYTOLOGY - PAP

## 2015-12-21 LAB — CULTURE, OB URINE
COLONY COUNT: NO GROWTH
Organism ID, Bacteria: NO GROWTH

## 2015-12-21 LAB — AFP, QUAD SCREEN
AFP: 54.6 ng/mL
Age Alone: 1:860 {titer}
Curr Gest Age: 20.2 wks.days
Down Syndrome Scr Risk Est: 1:2750 {titer}
HCG, Total: 9.38 IU/mL
INH: 332.8 pg/mL
Interpretation-AFP: NEGATIVE
MOM FOR HCG: 0.41
MOM FOR INH: 1.78
MoM for AFP: 0.79
Open Spina bifida: NEGATIVE
TRI 18 SCR RISK EST: NEGATIVE
Trisomy 18 (Edward) Syndrome Interp.: 1:6910 {titer}
UE3 MOM: 0.97
uE3 Value: 1.9 ng/mL

## 2015-12-24 ENCOUNTER — Other Ambulatory Visit: Payer: Self-pay | Admitting: Advanced Practice Midwife

## 2015-12-24 ENCOUNTER — Ambulatory Visit (HOSPITAL_COMMUNITY)
Admission: RE | Admit: 2015-12-24 | Discharge: 2015-12-24 | Disposition: A | Discharge status: Home / Self Care | Payer: Medicaid Other | Source: Ambulatory Visit | Attending: Advanced Practice Midwife | Admitting: Advanced Practice Midwife

## 2015-12-24 DIAGNOSIS — Z3492 Encounter for supervision of normal pregnancy, unspecified, second trimester: Secondary | ICD-10-CM

## 2015-12-24 DIAGNOSIS — Z3A21 21 weeks gestation of pregnancy: Secondary | ICD-10-CM | POA: Diagnosis not present

## 2015-12-24 DIAGNOSIS — O99332 Smoking (tobacco) complicating pregnancy, second trimester: Secondary | ICD-10-CM | POA: Insufficient documentation

## 2015-12-24 DIAGNOSIS — O99012 Anemia complicating pregnancy, second trimester: Secondary | ICD-10-CM

## 2015-12-24 DIAGNOSIS — O0932 Supervision of pregnancy with insufficient antenatal care, second trimester: Secondary | ICD-10-CM

## 2015-12-24 DIAGNOSIS — Z36 Encounter for antenatal screening of mother: Secondary | ICD-10-CM | POA: Diagnosis not present

## 2015-12-24 LAB — CANNABANOIDS (GC/LC/MS), URINE: THC-COOH (GC/LC/MS), ur confirm: 368 ng/mL — AB (ref <5)

## 2015-12-25 LAB — PRESCRIPTION MONITORING PROFILE (19 PANEL)
AMPHETAMINE/METH: NEGATIVE ng/mL
BARBITURATE SCREEN, URINE: NEGATIVE ng/mL
Benzodiazepine Screen, Urine: NEGATIVE ng/mL
Buprenorphine, Urine: NEGATIVE ng/mL
CARISOPRODOL, URINE: NEGATIVE ng/mL
Cocaine Metabolites: NEGATIVE ng/mL
Creatinine, Urine: 145.57 mg/dL (ref >20.0)
Fentanyl, Ur: NEGATIVE ng/mL
MDMA URINE: NEGATIVE ng/mL
Meperidine, Ur: NEGATIVE ng/mL
Methadone Screen, Urine: NEGATIVE ng/mL
Methaqualone: NEGATIVE ng/mL
NITRITES URINE, INITIAL: NEGATIVE ug/mL
OPIATE SCREEN, URINE: NEGATIVE ng/mL
OXYCODONE SCRN UR: NEGATIVE ng/mL
PHENCYCLIDINE, UR: NEGATIVE ng/mL
PROPOXYPHENE: NEGATIVE ng/mL
TAPENTADOLUR: NEGATIVE ng/mL
TRAMADOL UR: NEGATIVE ng/mL
Zolpidem, Urine: NEGATIVE ng/mL
pH, Initial: 6.9 pH (ref 4.5–8.9)

## 2015-12-26 ENCOUNTER — Encounter: Payer: Self-pay | Admitting: Advanced Practice Midwife

## 2015-12-26 DIAGNOSIS — R87810 Cervical high risk human papillomavirus (HPV) DNA test positive: Secondary | ICD-10-CM

## 2015-12-26 DIAGNOSIS — F1290 Cannabis use, unspecified, uncomplicated: Secondary | ICD-10-CM | POA: Insufficient documentation

## 2015-12-26 DIAGNOSIS — R87610 Atypical squamous cells of undetermined significance on cytologic smear of cervix (ASC-US): Secondary | ICD-10-CM | POA: Insufficient documentation

## 2016-01-14 ENCOUNTER — Encounter: Payer: Medicaid Other | Admitting: Obstetrics and Gynecology

## 2016-01-15 ENCOUNTER — Ambulatory Visit (INDEPENDENT_AMBULATORY_CARE_PROVIDER_SITE_OTHER): Payer: Medicaid Other | Admitting: Certified Nurse Midwife

## 2016-01-15 ENCOUNTER — Encounter: Payer: Self-pay | Admitting: Family

## 2016-01-15 VITALS — BP 109/51 | HR 74 | Wt 144.6 lb

## 2016-01-15 DIAGNOSIS — O0932 Supervision of pregnancy with insufficient antenatal care, second trimester: Secondary | ICD-10-CM

## 2016-01-15 DIAGNOSIS — Z1389 Encounter for screening for other disorder: Secondary | ICD-10-CM

## 2016-01-15 LAB — POCT URINALYSIS DIP (DEVICE)
BILIRUBIN URINE: NEGATIVE
GLUCOSE, UA: NEGATIVE mg/dL
KETONES UR: NEGATIVE mg/dL
Leukocytes, UA: NEGATIVE
NITRITE: NEGATIVE
PH: 8.5 — AB (ref 5.0–8.0)
PROTEIN: 30 mg/dL — AB
Specific Gravity, Urine: 1.02 (ref 1.005–1.030)
Urobilinogen, UA: 1 mg/dL (ref 0.0–1.0)

## 2016-01-15 NOTE — Addendum Note (Signed)
Addended by: Sherre LainASH, Kyron Schlitt A on: 01/15/2016 04:09 PM   Modules accepted: Orders

## 2016-01-15 NOTE — Progress Notes (Signed)
Pt states that she would like to have her discharge checked.

## 2016-01-15 NOTE — Progress Notes (Signed)
Subjective:  Joanna Melton is a 30 y.o. [id number] at 7157w1d being seen today for ongoing prenatal care.  She is currently monitored for the following issues for this high-risk pregnancy and   Patient reports no complaints.  Contractions: Not present. Vag. Bleeding: None.  Movement: Present. Denies leaking of fluid.   The following portions of the patient's history were reviewed and updated as appropriate: allergies, current medications, past family history, past medical history, past social history, past surgical history and problem list. Problem list updated.  Objective:   Filed Vitals:   01/15/16 1446  BP: 109/51  Pulse: 74  Weight: 144 lb 9.6 oz (65.59 kg)    Fetal Status: Fetal Heart Rate (bpm): 144   Movement: Present     General:  Alert, oriented and cooperative. Patient is in no acute distress.  Skin: Skin is warm and dry. No rash noted.   Cardiovascular: Normal heart rate noted  Respiratory: Normal respiratory effort, no problems with respiration noted  Abdomen: Soft, gravid, appropriate for gestational age. Pain/Pressure: Present     Pelvic: Vag. Bleeding: None Vag D/C Character: Mucous   Cervical exam deferred        Extremities: Normal range of motion.  Edema: None  Mental Status: Normal mood and affect. Normal behavior. Normal judgment and thought content.   Urinalysis:      Assessment and Plan:  Pregnancy: [id number] at 3457w1d  1. Late prenatal care affecting pregnancy in second trimester, antepartum  - US MFM OB FOLLOW UP; Future reevaluate kidneys scheduled for June 1  2. Encounter for routine screening for malformation using ultrasonics  - US MFM OB FOLLOW UP; Future  Preterm labor symptoms and general obstetric precautions including but not limited to vaginal bleeding, contractions, leaking of fluid and fetal movement were reviewed in detail with the patient. Please refer to After Visit Summary for other counseling recommendations.  Return in about 4 weeks  (around 02/12/2016).   Rhea PinkLori A Ithzel Fedorchak, CNM

## 2016-01-16 LAB — WET PREP, GENITAL
Trich, Wet Prep: NONE SEEN
WBC, Wet Prep HPF POC: NONE SEEN
Yeast Wet Prep HPF POC: NONE SEEN

## 2016-01-17 ENCOUNTER — Telehealth: Payer: Self-pay

## 2016-01-17 DIAGNOSIS — B9689 Other specified bacterial agents as the cause of diseases classified elsewhere: Secondary | ICD-10-CM

## 2016-01-17 DIAGNOSIS — N760 Acute vaginitis: Principal | ICD-10-CM

## 2016-01-17 MED ORDER — METRONIDAZOLE 500 MG PO TABS: 500.0000 mg | ORAL_TABLET | Freq: Two times a day (BID) | ORAL | Status: DC

## 2016-01-17 NOTE — Telephone Encounter (Signed)
Called patient and informed her of BV. Rx to pharmacy.

## 2016-01-17 NOTE — Telephone Encounter (Signed)
Please call in flagyl for her patient had abnormal urine. I have left a message for patient to call us back concerning her test results.

## 2016-02-12 ENCOUNTER — Encounter: Payer: Medicaid Other | Admitting: Obstetrics and Gynecology

## 2016-02-18 ENCOUNTER — Inpatient Hospital Stay (HOSPITAL_COMMUNITY)
Admission: AD | Admit: 2016-02-18 | Discharge: 2016-02-19 | Disposition: A | Discharge status: Home / Self Care | Payer: Medicaid Other | Source: Ambulatory Visit | Attending: Family Medicine | Admitting: Family Medicine

## 2016-02-18 ENCOUNTER — Encounter (HOSPITAL_COMMUNITY): Payer: Self-pay | Admitting: *Deleted

## 2016-02-18 DIAGNOSIS — Z3A39 39 weeks gestation of pregnancy: Secondary | ICD-10-CM | POA: Diagnosis not present

## 2016-02-18 DIAGNOSIS — Z3A29 29 weeks gestation of pregnancy: Secondary | ICD-10-CM | POA: Diagnosis not present

## 2016-02-18 DIAGNOSIS — O99333 Smoking (tobacco) complicating pregnancy, third trimester: Secondary | ICD-10-CM | POA: Diagnosis not present

## 2016-02-18 DIAGNOSIS — O99613 Diseases of the digestive system complicating pregnancy, third trimester: Secondary | ICD-10-CM | POA: Diagnosis not present

## 2016-02-18 DIAGNOSIS — F17210 Nicotine dependence, cigarettes, uncomplicated: Secondary | ICD-10-CM | POA: Insufficient documentation

## 2016-02-18 DIAGNOSIS — O210 Mild hyperemesis gravidarum: Secondary | ICD-10-CM | POA: Diagnosis not present

## 2016-02-18 DIAGNOSIS — K920 Hematemesis: Secondary | ICD-10-CM | POA: Insufficient documentation

## 2016-02-18 DIAGNOSIS — Z8249 Family history of ischemic heart disease and other diseases of the circulatory system: Secondary | ICD-10-CM | POA: Insufficient documentation

## 2016-02-18 LAB — URINALYSIS, ROUTINE W REFLEX MICROSCOPIC
BILIRUBIN URINE: NEGATIVE
Glucose, UA: NEGATIVE mg/dL
LEUKOCYTES UA: NEGATIVE
NITRITE: NEGATIVE
PROTEIN: NEGATIVE mg/dL
Specific Gravity, Urine: 1.02 (ref 1.005–1.030)
pH: 6 (ref 5.0–8.0)

## 2016-02-18 LAB — URINE MICROSCOPIC-ADD ON: WBC UA: NONE SEEN WBC/hpf (ref 0–5)

## 2016-02-18 MED ORDER — LACTATED RINGERS IV SOLN
25.0000 mg | Freq: Once | INTRAVENOUS | Status: AC
  Administered 2016-02-18: 25 mg via INTRAVENOUS
  Filled 2016-02-18: qty 1

## 2016-02-18 MED ORDER — LACTATED RINGERS IV BOLUS (SEPSIS)
1000.0000 mL | Freq: Once | INTRAVENOUS | Status: AC
  Administered 2016-02-18: 1000 mL via INTRAVENOUS

## 2016-02-18 NOTE — Discharge Instructions (Signed)
You were seen for vomiting blood. This is likely from retching or vomiting that caused some injury to your swallowing tube. This usually resolves on its own once vomiting is under control. We gave you fluid and medication to help with nausea and vomiting. We are discharging you with more nausea medication to be taken as needed.   SEEK MEDICAL CARE IF:   The vomiting of blood worsens, or begins again after it has stopped.  You have persistent stomach pain.  You have nausea, indigestion, or heartburn.  You feel weak or dizzy. SEEK IMMEDIATE MEDICAL CARE IF:   You faint or feel extremely weak.  You have a rapid heartbeat.  You are urinating less than normal or not at all.  You have persistent vomiting.  You vomit large amounts of bloody or dark material.  You vomit bright red blood.  You pass large, dark, or bloody stools.  You have chest pain or trouble breathing.   This information is not intended to replace advice given to you by your health care provider. Make sure you discuss any questions you have with your health care provider.   Document Released: 10/16/2004 Document Revised: 09/29/2014 Document Reviewed: 05/03/2014 Elsevier Interactive Patient Education [employer]2016 Elsevier Inc.

## 2016-02-18 NOTE — MAU Provider Note (Signed)
MAU HISTORY AND PHYSICAL  Chief Complaint:  Hematemesis   Joanna Melton is a 30 y.o.  [id number] with IUP at 2711w0d presenting for Hematemesis. Started having emesis about 4 pm this afternoon. Initially, nonbloody, then with some blood. Emesis x 5. Denies diarrhea or fever. Ate some noodles about 2 pm. Reports belly pain which was constant. Denies dysuria. Baby moving as usual. Denies vaginal bleeding or fluid leak. Denies headache or vision changes. Reports generalized weakness.  Reports smoking about 3 cigs a day. Denies EtOH. Reports smoking Marijuana about a week ago.   Past Medical History  Diagnosis Date  . Chlamydia   . Medical history non-contributory   . Anemia   . Anxiety   . Depression     Past Surgical History  Procedure Laterality Date  . Induced abortion      Family History  Problem Relation Age of Onset  . Hypertension Maternal Grandfather   . Diabetes Maternal Grandfather   . Hypertension Mother     Social History  Substance Use Topics  . Smoking status: Current Every Day Smoker -- 0.50 packs/day for 10 years    Types: Cigarettes  . Smokeless tobacco: Never Used     Comment: trying to cut back on smoking  . Alcohol Use: No     Comment: occasional    No Known Allergies  Prescriptions prior to admission  Medication Sig Dispense Refill Last Dose  . acetaminophen (TYLENOL) 500 MG tablet Take 500 mg by mouth every 6 (six) hours as needed.   Taking  . metroNIDAZOLE (FLAGYL) 500 MG tablet Take 1 tablet (500 mg total) by mouth 2 (two) times daily. 14 tablet 0   . Prenatal Vit-Fe Fumarate-FA (PRENATAL COMPLETE) 14-0.4 MG TABS Take 1 tablet by mouth daily. 60 each 6 Taking    Review of Systems - Negative except for what is mentioned in HPI.  Physical Exam  Blood pressure 112/58, pulse 87, temperature 98.1 F (36.7 C), temperature source Oral, resp. rate 16, height 5\' 1"  (1.549 m), weight 146 lb 12.8 oz (66.588 kg), last menstrual period 08/08/2015, SpO2 98  %, unknown if currently breastfeeding. GENERAL: Well-developed, well-nourished female in no acute distress.  LUNGS: Clear to auscultation bilaterally.  HEART: Regular rate and rhythm. ABDOMEN: Soft, nontender, nondistended, gravid.  EXTREMITIES: Nontender, no edema, 2+ distal pulses. Cervical Exam: not assessed Presentation: not assessed FHT:  reactive Contractions: none noted on toco   Labs: Results for orders placed or performed during the hospital encounter of 02/18/16 (from the past 24 hour(s))  Urinalysis, Routine w reflex microscopic (not at [facility])   Collection Time: 02/18/16  9:59 PM  Result Value Ref Range   Color, Urine YELLOW YELLOW   APPearance CLEAR CLEAR   Specific Gravity, Urine 1.020 1.005 - 1.030   pH 6.0 5.0 - 8.0   Glucose, UA NEGATIVE NEGATIVE mg/dL   Hgb urine dipstick TRACE (A) NEGATIVE   Bilirubin Urine NEGATIVE NEGATIVE   Ketones, ur >80 (A) NEGATIVE mg/dL   Protein, ur NEGATIVE NEGATIVE mg/dL   Nitrite NEGATIVE NEGATIVE   Leukocytes, UA NEGATIVE NEGATIVE  Urine microscopic-add on   Collection Time: 02/18/16  9:59 PM  Result Value Ref Range   Squamous Epithelial / LPF 0-5 (A) NONE SEEN   WBC, UA NONE SEEN 0 - 5 WBC/hpf   RBC / HPF 0-5 0 - 5 RBC/hpf   Bacteria, UA FEW (A) NONE SEEN    Imaging Studies:  No results found.  Assessment: Joanna FriedlanderValerie M  Swamy is  30 y.o. F6O1308 at [redacted]w[redacted]d presents with Hematemesis likely Mallory-Weiss. Urinalysis significant for ketonuria greater than 80. Received LR 1 L bolus and Promethazine 25 mg in 1L LR. No further emesis here. Tolerated by mouth fluids here after IV fluids  Plan: -Promethazine 12.5 mg four times a day as needed nausea/vomiting -Discussed return precautions -Follow up as needed  Almon Hercules 5/30/20171:23 AM   CNM attestation:  I have seen and examined this patient; I agree with above documentation in the resident's note.   Joanna Melton is a 30 y.o. [phone] reporting N/V w/  hematemesis +FM, denies LOF, VB, contractions, vaginal discharge.  PE: BP 112/58 mmHg  Pulse 87  Temp(Src) 98.1 F (36.7 C) (Oral)  Resp 16  Ht  (1.549 m)  Wt 66.588 kg (146 lb 12.8 oz)  BMI 27.75 kg/m2  SpO2 98%  LMP 08/08/2015 Gen: calm comfortable, NAD Resp: normal effort, no distress Abd: gravid  ROS, labs, PMH reviewed NST reactive without ctx  Plan: - fetal kick counts reinforced, preterm labor precautions - given rx for phenergan - continue routine follow up in OB clinic  Cam Hai, CNM 1:29 AM  02/19/2016

## 2016-02-18 NOTE — MAU Note (Signed)
Patient states she started vomiting around 4pm today and is now starting to vomit blood.  Denies fever or diarrhea.  States, "it may be related to stress."  Reports +fetal movement.  Denies vaginal bleeding or LOF.

## 2016-02-19 DIAGNOSIS — O210 Mild hyperemesis gravidarum: Secondary | ICD-10-CM | POA: Diagnosis not present

## 2016-02-19 DIAGNOSIS — Z3A39 39 weeks gestation of pregnancy: Secondary | ICD-10-CM

## 2016-02-19 MED ORDER — PROMETHAZINE HCL 12.5 MG PO TABS
12.5000 mg | ORAL_TABLET | Freq: Four times a day (QID) | ORAL | Status: DC | PRN
Start: 2016-02-19 — End: 2016-05-08

## 2016-02-26 ENCOUNTER — Other Ambulatory Visit: Payer: Self-pay | Admitting: Certified Nurse Midwife

## 2016-02-26 ENCOUNTER — Encounter: Payer: Medicaid Other | Admitting: Obstetrics and Gynecology

## 2016-02-26 ENCOUNTER — Ambulatory Visit (HOSPITAL_COMMUNITY)
Admission: RE | Admit: 2016-02-26 | Discharge: 2016-02-26 | Disposition: A | Discharge status: Home / Self Care | Payer: Medicaid Other | Source: Ambulatory Visit | Attending: Certified Nurse Midwife | Admitting: Certified Nurse Midwife

## 2016-02-26 ENCOUNTER — Encounter (HOSPITAL_COMMUNITY): Payer: Self-pay

## 2016-02-26 ENCOUNTER — Ambulatory Visit (HOSPITAL_COMMUNITY): Admission: RE | Admit: 2016-02-26 | Payer: Medicaid Other | Source: Ambulatory Visit

## 2016-02-26 VITALS — BP 112/57 | HR 78 | Wt 151.0 lb

## 2016-02-26 DIAGNOSIS — O358XX0 Maternal care for other (suspected) fetal abnormality and damage, not applicable or unspecified: Secondary | ICD-10-CM

## 2016-02-26 DIAGNOSIS — O99333 Smoking (tobacco) complicating pregnancy, third trimester: Secondary | ICD-10-CM | POA: Diagnosis not present

## 2016-02-26 DIAGNOSIS — Z1389 Encounter for screening for other disorder: Secondary | ICD-10-CM

## 2016-02-26 DIAGNOSIS — Z3A30 30 weeks gestation of pregnancy: Secondary | ICD-10-CM

## 2016-02-26 DIAGNOSIS — O0933 Supervision of pregnancy with insufficient antenatal care, third trimester: Secondary | ICD-10-CM

## 2016-02-26 DIAGNOSIS — O35EXX0 Maternal care for other (suspected) fetal abnormality and damage, fetal genitourinary anomalies, not applicable or unspecified: Secondary | ICD-10-CM

## 2016-02-26 DIAGNOSIS — O283 Abnormal ultrasonic finding on antenatal screening of mother: Secondary | ICD-10-CM | POA: Diagnosis not present

## 2016-02-26 DIAGNOSIS — F1290 Cannabis use, unspecified, uncomplicated: Secondary | ICD-10-CM

## 2016-02-26 DIAGNOSIS — O0932 Supervision of pregnancy with insufficient antenatal care, second trimester: Secondary | ICD-10-CM

## 2016-02-26 DIAGNOSIS — Z363 Encounter for antenatal screening for malformations: Secondary | ICD-10-CM

## 2016-03-20 ENCOUNTER — Inpatient Hospital Stay (HOSPITAL_COMMUNITY): Payer: Medicaid Other

## 2016-03-20 ENCOUNTER — Encounter (HOSPITAL_COMMUNITY): Payer: Self-pay | Admitting: *Deleted

## 2016-03-20 ENCOUNTER — Inpatient Hospital Stay (HOSPITAL_COMMUNITY)
Admission: AD | Admit: 2016-03-20 | Discharge: 2016-03-20 | Disposition: A | Discharge status: Home / Self Care | Payer: Medicaid Other | Source: Ambulatory Visit | Attending: Family Medicine | Admitting: Family Medicine

## 2016-03-20 DIAGNOSIS — Z3A34 34 weeks gestation of pregnancy: Secondary | ICD-10-CM | POA: Diagnosis not present

## 2016-03-20 DIAGNOSIS — W19XXXA Unspecified fall, initial encounter: Secondary | ICD-10-CM

## 2016-03-20 DIAGNOSIS — F419 Anxiety disorder, unspecified: Secondary | ICD-10-CM | POA: Insufficient documentation

## 2016-03-20 DIAGNOSIS — R102 Pelvic and perineal pain: Secondary | ICD-10-CM | POA: Diagnosis not present

## 2016-03-20 DIAGNOSIS — F329 Major depressive disorder, single episode, unspecified: Secondary | ICD-10-CM | POA: Insufficient documentation

## 2016-03-20 DIAGNOSIS — O26899 Other specified pregnancy related conditions, unspecified trimester: Secondary | ICD-10-CM

## 2016-03-20 DIAGNOSIS — M25552 Pain in left hip: Secondary | ICD-10-CM

## 2016-03-20 DIAGNOSIS — O99333 Smoking (tobacco) complicating pregnancy, third trimester: Secondary | ICD-10-CM | POA: Diagnosis not present

## 2016-03-20 DIAGNOSIS — O99343 Other mental disorders complicating pregnancy, third trimester: Secondary | ICD-10-CM | POA: Diagnosis not present

## 2016-03-20 DIAGNOSIS — O9989 Other specified diseases and conditions complicating pregnancy, childbirth and the puerperium: Secondary | ICD-10-CM | POA: Diagnosis not present

## 2016-03-20 DIAGNOSIS — Y92009 Unspecified place in unspecified non-institutional (private) residence as the place of occurrence of the external cause: Secondary | ICD-10-CM

## 2016-03-20 MED ORDER — TRAMADOL HCL 50 MG PO TABS: 50.0000 mg | ORAL_TABLET | Freq: Four times a day (QID) | ORAL | Status: DC | PRN

## 2016-03-20 MED ORDER — ACETAMINOPHEN 500 MG PO TABS
1000.0000 mg | ORAL_TABLET | Freq: Once | ORAL | Status: AC
  Administered 2016-03-20: 1000 mg via ORAL
  Filled 2016-03-20: qty 2

## 2016-03-20 MED ORDER — CYCLOBENZAPRINE HCL 5 MG PO TABS
5.0000 mg | ORAL_TABLET | Freq: Once | ORAL | Status: AC
  Administered 2016-03-20: 5 mg via ORAL
  Filled 2016-03-20: qty 1

## 2016-03-20 NOTE — MAU Note (Signed)
Pt reports she fell yesterday into a split position. Did not hurt at first but started to feel pain on left side and vaginal area. + Fetal movement reported.  Denies any vag bleeding or discharge.

## 2016-03-20 NOTE — Discharge Instructions (Signed)
Fall Prevention in the Home  Falls can cause injuries and can affect people from all age groups. There are many simple things that you can do to make your home safe and to help prevent falls. WHAT CAN I DO ON THE OUTSIDE OF MY HOME?  Regularly repair the edges of walkways and driveways and fix any cracks.  Remove high doorway thresholds.  Trim any shrubbery on the main path into your home.  Use bright outdoor lighting.  Clear walkways of debris and clutter, including tools and rocks.  Regularly check that handrails are securely fastened and in good repair. Both sides of any steps should have handrails.  Install guardrails along the edges of any raised decks or porches.  Have leaves, snow, and ice cleared regularly.  Use sand or salt on walkways during winter months.  In the garage, clean up any spills right away, including grease or oil spills. WHAT CAN I DO IN THE BATHROOM?  Use night lights.  Install grab bars by the toilet and in the tub and shower. Do not use towel bars as grab bars.  Use non-skid mats or decals on the floor of the tub or shower.  If you need to sit down while you are in the shower, use a plastic, non-slip stool..  Keep the floor dry. Immediately clean up any water that spills on the floor.  Remove soap buildup in the tub or shower on a regular basis.  Attach bath mats securely with double-sided non-slip rug tape.  Remove throw rugs and other tripping hazards from the floor. WHAT CAN I DO IN THE BEDROOM?  Use night lights.  Make sure that a bedside light is easy to reach.  Do not use oversized bedding that drapes onto the floor.  Have a firm chair that has side arms to use for getting dressed.  Remove throw rugs and other tripping hazards from the floor. WHAT CAN I DO IN THE KITCHEN?   Clean up any spills right away.  Avoid walking on wet floors.  Place frequently used items in easy-to-reach places.  If you need to reach for something  above you, use a sturdy step stool that has a grab bar.  Keep electrical cables out of the way.  Do not use floor polish or wax that makes floors slippery. If you have to use wax, make sure that it is non-skid floor wax.  Remove throw rugs and other tripping hazards from the floor. WHAT CAN I DO IN THE STAIRWAYS?  Do not leave any items on the stairs.  Make sure that there are handrails on both sides of the stairs. Fix handrails that are broken or loose. Make sure that handrails are as long as the stairways.  Check any carpeting to make sure that it is firmly attached to the stairs. Fix any carpet that is loose or worn.  Avoid having throw rugs at the top or bottom of stairways, or secure the rugs with carpet tape to prevent them from moving.  Make sure that you have a light switch at the top of the stairs and the bottom of the stairs. If you do not have them, have them installed. WHAT ARE SOME OTHER FALL PREVENTION TIPS?  Wear closed-toe shoes that fit well and support your feet. Wear shoes that have rubber soles or low heels.  When you use a stepladder, make sure that it is completely opened and that the sides are firmly locked. Have someone hold the ladder while you   are using it. Do not climb a closed stepladder.  Add color or contrast paint or tape to grab bars and handrails in your home. Place contrasting color strips on the first and last steps.  Use mobility aids as needed, such as canes, walkers, scooters, and crutches.  Turn on lights if it is dark. Replace any light bulbs that burn out.  Set up furniture so that there are clear paths. Keep the furniture in the same spot.  Fix any uneven floor surfaces.  Choose a carpet design that does not hide the edge of steps of a stairway.  Be aware of any and all pets.  Review your medicines with your healthcare provider. Some medicines can cause dizziness or changes in blood pressure, which increase your risk of falling. Talk  with your health care provider about other ways that you can decrease your risk of falls. This may include working with a physical therapist or trainer to improve your strength, balance, and endurance.   This information is not intended to replace advice given to you by your health care provider. Make sure you discuss any questions you have with your health care provider.   Document Released: 08/29/2002 Document Revised: 01/23/2015 Document Reviewed: 10/13/2014 Elsevier Interactive Patient Education 2016 Elsevier Inc.  

## 2016-03-20 NOTE — MAU Note (Signed)
Urine in lab 

## 2016-03-20 NOTE — MAU Provider Note (Signed)
History     CSN: [id number]  Arrival date and time: 03/20/16 1116   First Provider Initiated Contact with Patient 03/20/16 1233      Chief Complaint  Patient presents with  . Fall   HPI   [name] is a 30 y.o. female here with left hip and pelvic pain after a fall she experienced yesterday around 1600. She was walking down some stairs carrying some sheets and slipped on one of the sheets. She kind of did the splits when she fell. She did not hit her abdomen, she did land on her vagina very hard. "I think I broke by bone".   She is not having vaginal bleeding. She is having pain in her vagina, the pain is in the bone of the vagina.  She was able to walk into MAU without trouble   + fetal movement Denies gushes of fluid.   OB History    Gravida Para Term Preterm AB TAB SAB Ectopic Multiple Living   6 3 3  0 2 1 1  0 0 3      Past Medical History  Diagnosis Date  . Chlamydia   . Medical history non-contributory   . Anemia   . Anxiety   . Depression     Past Surgical History  Procedure Laterality Date  . Induced abortion      Family History  Problem Relation Age of Onset  . Hypertension Maternal Grandfather   . Diabetes Maternal Grandfather   . Hypertension Mother     Social History  Substance Use Topics  . Smoking status: Current Every Day Smoker -- 0.50 packs/day for 10 years    Types: Cigarettes  . Smokeless tobacco: Never Used     Comment: trying to cut back on smoking  . Alcohol Use: No     Comment: occasional    Allergies: No Known Allergies  Prescriptions prior to admission  Medication Sig Dispense Refill Last Dose  . acetaminophen (TYLENOL) 500 MG tablet Take 500 mg by mouth every 6 (six) hours as needed.   03/19/2016 at Unknown time  . Prenatal Vit-Fe Fumarate-FA (PRENATAL COMPLETE) 14-0.4 MG TABS Take 1 tablet by mouth daily. 60 each 6 Past Week at Unknown time  . promethazine (PHENERGAN) 12.5 MG tablet Take 1 tablet (12.5 mg total)  by mouth every 6 (six) hours as needed for nausea or vomiting. 20 tablet 0 Past Week at Unknown time   No results found for this or any previous visit (from the past 48 hour(s)).   Dg Hip Unilat With Pelvis 2-3 Views Left  03/20/2016  CLINICAL DATA:  Status post fall.  Left hip and pelvic pain. EXAM: DG HIP (WITH OR WITHOUT PELVIS) 2-3V LEFT COMPARISON:  None. FINDINGS: There is no left hip fracture or dislocation. There is diastases of the pubic symphysis, likely chronic. There is no aggressive lytic or sclerotic osseous lesion. There is a fetus present within the abdomen and pelvis in cephalic presentation. IMPRESSION: 1. No acute osseous injury of the left hip. 2. Diastases of the pubic symphysis, likely chronic. 3. Fetus in cephalic presentation. Electronically Signed   By: Elige KoHetal  Patel   On: 03/20/2016 16:42    Review of Systems  Constitutional: Negative for fever and chills.  Gastrointestinal: Positive for abdominal pain (Ache in lower abdomen ).  Musculoskeletal: Positive for joint pain and falls.   Physical Exam   Blood pressure 115/65, pulse 73, temperature 98.8 F (37.1 C), temperature source Oral, resp. rate 18,  height 5\' 1"  (1.549 m), weight 150 lb 6.4 oz (68.221 kg), last menstrual period 08/08/2015, unknown if currently breastfeeding.  Physical Exam  Constitutional: She is oriented to person, place, and time. She appears well-developed and well-nourished. No distress.  HENT:  Head: Normocephalic.  Eyes: Pupils are equal, round, and reactive to light.  Respiratory: Effort normal.  Genitourinary:  Dilation: Closed Effacement (%): Thick Cervical Position: Middle Exam by:: Venia CarbonJennifer Nuala Chiles, NP  Musculoskeletal: Normal range of motion.  Neurological: She is alert and oriented to person, place, and time.  Skin: Skin is warm. She is not diaphoretic.  Psychiatric: Her behavior is normal.    Fetal Tracing:  Baseline: 125 bpm  Variability: Moderate  Accelerations:  15x15 Decelerations: none Toco: quiet   MAU Course  Procedures  None  MDM  Tylenol 1 gram PO Flexeril 5 mg PO  Rx of left hip and pelvis normal.   Assessment and Plan   A:  1. Fall at home, initial encounter   2. Pelvic pain affecting pregnancy   3. Left hip pain     P:  Discharge home in stable condition Rx: Ultram X 6 doses only Return to MAU if pain worsens Fetal kick counts Return to MAU with any leaking of fluid or vaginal bleeding  Keep your next OB appointment.    Duane LopeJennifer I Arielle Eber, NP 03/20/2016 4:20 PM

## 2016-04-02 ENCOUNTER — Emergency Department (HOSPITAL_COMMUNITY)
Admission: EM | Admit: 2016-04-02 | Discharge: 2016-04-02 | Disposition: A | Discharge status: Home / Self Care | Payer: Medicaid Other | Attending: Emergency Medicine | Admitting: Emergency Medicine

## 2016-04-02 ENCOUNTER — Encounter (HOSPITAL_COMMUNITY): Payer: Self-pay

## 2016-04-02 DIAGNOSIS — M542 Cervicalgia: Secondary | ICD-10-CM | POA: Diagnosis not present

## 2016-04-02 DIAGNOSIS — Z3A36 36 weeks gestation of pregnancy: Secondary | ICD-10-CM | POA: Diagnosis not present

## 2016-04-02 DIAGNOSIS — O99333 Smoking (tobacco) complicating pregnancy, third trimester: Secondary | ICD-10-CM | POA: Diagnosis not present

## 2016-04-02 DIAGNOSIS — O9A213 Injury, poisoning and certain other consequences of external causes complicating pregnancy, third trimester: Secondary | ICD-10-CM | POA: Diagnosis not present

## 2016-04-02 DIAGNOSIS — Z3490 Encounter for supervision of normal pregnancy, unspecified, unspecified trimester: Secondary | ICD-10-CM

## 2016-04-02 DIAGNOSIS — F17210 Nicotine dependence, cigarettes, uncomplicated: Secondary | ICD-10-CM | POA: Insufficient documentation

## 2016-04-02 DIAGNOSIS — M545 Low back pain: Secondary | ICD-10-CM | POA: Diagnosis not present

## 2016-04-02 NOTE — Discharge Instructions (Signed)
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
## 2016-04-02 NOTE — ED Notes (Signed)
Rapid rapid OB nurse called for PT
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
## 2016-04-02 NOTE — ED Notes (Signed)
Pt refused evaluation by rapid response RN
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
## 2016-04-03 ENCOUNTER — Encounter (HOSPITAL_COMMUNITY): Payer: Self-pay

## 2016-04-03 ENCOUNTER — Other Ambulatory Visit (HOSPITAL_COMMUNITY): Payer: Self-pay | Admitting: Maternal and Fetal Medicine

## 2016-04-03 ENCOUNTER — Ambulatory Visit (HOSPITAL_COMMUNITY)
Admission: RE | Admit: 2016-04-03 | Discharge: 2016-04-03 | Disposition: A | Discharge status: Home / Self Care | Payer: Medicaid Other | Source: Ambulatory Visit | Attending: Certified Nurse Midwife | Admitting: Certified Nurse Midwife

## 2016-04-03 DIAGNOSIS — O26843 Uterine size-date discrepancy, third trimester: Secondary | ICD-10-CM | POA: Diagnosis not present

## 2016-04-03 DIAGNOSIS — O99333 Smoking (tobacco) complicating pregnancy, third trimester: Secondary | ICD-10-CM | POA: Diagnosis present

## 2016-04-03 DIAGNOSIS — Z3A35 35 weeks gestation of pregnancy: Secondary | ICD-10-CM | POA: Diagnosis not present

## 2016-04-03 DIAGNOSIS — O0933 Supervision of pregnancy with insufficient antenatal care, third trimester: Secondary | ICD-10-CM

## 2016-04-03 DIAGNOSIS — O35EXX0 Maternal care for other (suspected) fetal abnormality and damage, fetal genitourinary anomalies, not applicable or unspecified: Secondary | ICD-10-CM

## 2016-04-03 DIAGNOSIS — O358XX0 Maternal care for other (suspected) fetal abnormality and damage, not applicable or unspecified: Secondary | ICD-10-CM

## 2016-04-03 DIAGNOSIS — O283 Abnormal ultrasonic finding on antenatal screening of mother: Secondary | ICD-10-CM | POA: Diagnosis not present

## 2016-05-01 ENCOUNTER — Ambulatory Visit (INDEPENDENT_AMBULATORY_CARE_PROVIDER_SITE_OTHER): Payer: Medicaid Other | Admitting: Obstetrics & Gynecology

## 2016-05-01 DIAGNOSIS — O0932 Supervision of pregnancy with insufficient antenatal care, second trimester: Secondary | ICD-10-CM

## 2016-05-01 DIAGNOSIS — Z23 Encounter for immunization: Secondary | ICD-10-CM | POA: Diagnosis not present

## 2016-05-01 DIAGNOSIS — O0933 Supervision of pregnancy with insufficient antenatal care, third trimester: Secondary | ICD-10-CM

## 2016-05-01 LAB — OB RESULTS CONSOLE GBS: STREP GROUP B AG: NEGATIVE

## 2016-05-01 LAB — POCT URINALYSIS DIP (DEVICE)
BILIRUBIN URINE: NEGATIVE
Glucose, UA: NEGATIVE mg/dL
KETONES UR: NEGATIVE mg/dL
NITRITE: NEGATIVE
PH: 6 (ref 5.0–8.0)
Protein, ur: NEGATIVE mg/dL
Urobilinogen, UA: 0.2 mg/dL (ref 0.0–1.0)

## 2016-05-01 NOTE — Progress Notes (Signed)
Had some spotting last night but not sure if its from rectum or vagina. Complains of new hemorrhoids.

## 2016-05-01 NOTE — Progress Notes (Signed)
Subjective:  Joanna Melton is a 30 y.o. [id number] at 1275w3d being seen today for ongoing prenatal care.  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  Patient reports no complaints.  Contractions: Irritability. Vag. Bleeding: Scant.  Movement: Present. Denies leaking of fluid.   The following portions of the patient's history were reviewed and updated as appropriate: allergies, current medications, past family history, past medical history, past social history, past surgical history and problem list. Problem list updated.  Objective:   Vitals:   05/01/16 0918  BP: 117/61  Pulse: 68  Weight: 158 lb 14.4 oz (72.1 kg)    Fetal Status: Fetal Heart Rate (bpm): 130   Movement: Present     General:  Alert, oriented and cooperative. Patient is in no acute distress.  Skin: Skin is warm and dry. No rash noted.   Cardiovascular: Normal heart rate noted  Respiratory: Normal respiratory effort, no problems with respiration noted  Abdomen: Soft, gravid, appropriate for gestational age. Pain/Pressure: Present     Pelvic:  Cervical exam performed      2/50/-3  Extremities: Normal range of motion.  Edema: None  Mental Status: Normal mood and affect. Normal behavior. Normal judgment and thought content.   Urinalysis: Urine Protein: Negative Urine Glucose: Negative  Assessment and Plan:  Pregnancy: [id number] at 675w3d  1. Late prenatal care affecting pregnancy in third trimester, antepartum - GC/Chlamydia Probe Amp - Culture, beta strep (group b only) - Tdap - post dates testing if not delivered by 40 weeks. -Glucola if patient will stay.      Term labor symptoms and general obstetric precautions including but not limited to vaginal bleeding,  contractions, leaking of fluid and fetal movement were reviewed in detail with the patient. Please refer to After Visit Summary for other counseling recommendations.  Return in 1 week (on 05/08/2016).   Lesly DukesKelly H Autumn Gunn, MD

## 2016-05-04 LAB — CULTURE, BETA STREP (GROUP B ONLY)

## 2016-05-05 ENCOUNTER — Inpatient Hospital Stay (HOSPITAL_COMMUNITY)
Admission: AD | Admit: 2016-05-05 | Discharge: 2016-05-08 | DRG: 774 | Disposition: A | Discharge status: Home / Self Care | Payer: Medicaid Other | Source: Ambulatory Visit | Attending: Obstetrics & Gynecology | Admitting: Obstetrics & Gynecology

## 2016-05-05 ENCOUNTER — Inpatient Hospital Stay (HOSPITAL_COMMUNITY): Payer: Medicaid Other | Admitting: Anesthesiology

## 2016-05-05 ENCOUNTER — Encounter (HOSPITAL_COMMUNITY): Payer: Self-pay

## 2016-05-05 DIAGNOSIS — F1290 Cannabis use, unspecified, uncomplicated: Secondary | ICD-10-CM | POA: Diagnosis present

## 2016-05-05 DIAGNOSIS — O99324 Drug use complicating childbirth: Secondary | ICD-10-CM | POA: Diagnosis present

## 2016-05-05 DIAGNOSIS — O0933 Supervision of pregnancy with insufficient antenatal care, third trimester: Secondary | ICD-10-CM

## 2016-05-05 DIAGNOSIS — F17210 Nicotine dependence, cigarettes, uncomplicated: Secondary | ICD-10-CM | POA: Diagnosis present

## 2016-05-05 DIAGNOSIS — O99334 Smoking (tobacco) complicating childbirth: Secondary | ICD-10-CM | POA: Diagnosis present

## 2016-05-05 DIAGNOSIS — O4593 Premature separation of placenta, unspecified, third trimester: Principal | ICD-10-CM | POA: Diagnosis present

## 2016-05-05 DIAGNOSIS — Z3483 Encounter for supervision of other normal pregnancy, third trimester: Secondary | ICD-10-CM | POA: Diagnosis present

## 2016-05-05 DIAGNOSIS — Z833 Family history of diabetes mellitus: Secondary | ICD-10-CM | POA: Diagnosis not present

## 2016-05-05 DIAGNOSIS — Z3A40 40 weeks gestation of pregnancy: Secondary | ICD-10-CM

## 2016-05-05 DIAGNOSIS — Z8249 Family history of ischemic heart disease and other diseases of the circulatory system: Secondary | ICD-10-CM

## 2016-05-05 DIAGNOSIS — IMO0001 Reserved for inherently not codable concepts without codable children: Secondary | ICD-10-CM

## 2016-05-05 LAB — CBC
HCT: 32.1 % — ABNORMAL LOW (ref 36.0–46.0)
Hemoglobin: 11 g/dL — ABNORMAL LOW (ref 12.0–15.0)
MCH: 30.6 pg (ref 26.0–34.0)
MCHC: 34.3 g/dL (ref 30.0–36.0)
MCV: 89.4 fL (ref 78.0–100.0)
PLATELETS: 233 10*3/uL (ref 150–400)
RBC: 3.59 MIL/uL — AB (ref 3.87–5.11)
RDW: 14.7 % (ref 11.5–15.5)
WBC: 10.8 10*3/uL — AB (ref 4.0–10.5)

## 2016-05-05 LAB — TYPE AND SCREEN
ABO/RH(D): O POS
Antibody Screen: NEGATIVE

## 2016-05-05 MED ORDER — FENTANYL CITRATE (PF) 100 MCG/2ML IJ SOLN
50.0000 ug | INTRAMUSCULAR | Status: DC | PRN
  Administered 2016-05-05: 50 ug via INTRAVENOUS
  Filled 2016-05-05: qty 2

## 2016-05-05 MED ORDER — ONDANSETRON HCL 4 MG/2ML IJ SOLN: 4.0000 mg | Freq: Four times a day (QID) | INTRAMUSCULAR | Status: DC | PRN

## 2016-05-05 MED ORDER — FENTANYL 2.5 MCG/ML BUPIVACAINE 1/10 % EPIDURAL INFUSION (WH - ANES): 14.0000 mL/h | INTRAMUSCULAR | Status: DC | PRN

## 2016-05-05 MED ORDER — FLEET ENEMA 7-19 GM/118ML RE ENEM: 1.0000 | ENEMA | RECTAL | Status: DC | PRN

## 2016-05-05 MED ORDER — EPHEDRINE 5 MG/ML INJ
10.0000 mg | INTRAVENOUS | Status: DC | PRN
  Filled 2016-05-05: qty 4

## 2016-05-05 MED ORDER — SOD CITRATE-CITRIC ACID 500-334 MG/5ML PO SOLN: 30.0000 mL | ORAL | Status: DC | PRN

## 2016-05-05 MED ORDER — OXYCODONE-ACETAMINOPHEN 5-325 MG PO TABS
1.0000 | ORAL_TABLET | ORAL | Status: DC | PRN
Start: 2016-05-05 — End: 2016-05-06

## 2016-05-05 MED ORDER — ACETAMINOPHEN 325 MG PO TABS: 650.0000 mg | ORAL_TABLET | ORAL | Status: DC | PRN

## 2016-05-05 MED ORDER — OXYTOCIN BOLUS FROM INFUSION
500.0000 mL | Freq: Once | INTRAVENOUS | Status: AC
  Administered 2016-05-06: 500 mL via INTRAVENOUS

## 2016-05-05 MED ORDER — DIPHENHYDRAMINE HCL 50 MG/ML IJ SOLN: 12.5000 mg | INTRAMUSCULAR | Status: DC | PRN

## 2016-05-05 MED ORDER — LACTATED RINGERS IV SOLN
INTRAVENOUS | Status: DC
  Administered 2016-05-05 – 2016-05-06 (×2): via INTRAVENOUS

## 2016-05-05 MED ORDER — PHENYLEPHRINE 40 MCG/ML (10ML) SYRINGE FOR IV PUSH (FOR BLOOD PRESSURE SUPPORT)
80.0000 ug | PREFILLED_SYRINGE | INTRAVENOUS | Status: DC | PRN
  Filled 2016-05-05: qty 5
  Filled 2016-05-05: qty 10

## 2016-05-05 MED ORDER — LACTATED RINGERS IV SOLN: 500.0000 mL | INTRAVENOUS | Status: DC | PRN

## 2016-05-05 MED ORDER — OXYCODONE-ACETAMINOPHEN 5-325 MG PO TABS: 2.0000 | ORAL_TABLET | ORAL | Status: DC | PRN

## 2016-05-05 MED ORDER — LIDOCAINE HCL (PF) 1 % IJ SOLN
30.0000 mL | INTRAMUSCULAR | Status: DC | PRN
  Filled 2016-05-05: qty 30

## 2016-05-05 MED ORDER — LIDOCAINE HCL (PF) 1 % IJ SOLN
INTRAMUSCULAR | Status: DC | PRN
  Administered 2016-05-05 (×2): 4 mL

## 2016-05-05 MED ORDER — FENTANYL 2.5 MCG/ML BUPIVACAINE 1/10 % EPIDURAL INFUSION (WH - ANES)
14.0000 mL/h | INTRAMUSCULAR | Status: DC | PRN
Start: 2016-05-05 — End: 2016-05-06
  Administered 2016-05-05: 14 mL/h via EPIDURAL
  Filled 2016-05-05: qty 125

## 2016-05-05 MED ORDER — OXYTOCIN 40 UNITS IN LACTATED RINGERS INFUSION - SIMPLE MED
2.5000 [IU]/h | INTRAVENOUS | Status: DC
  Filled 2016-05-05: qty 1000

## 2016-05-05 MED ORDER — LACTATED RINGERS IV SOLN
500.0000 mL | Freq: Once | INTRAVENOUS | Status: AC
  Administered 2016-05-05: 500 mL via INTRAVENOUS

## 2016-05-05 MED ORDER — PHENYLEPHRINE 40 MCG/ML (10ML) SYRINGE FOR IV PUSH (FOR BLOOD PRESSURE SUPPORT)
80.0000 ug | PREFILLED_SYRINGE | INTRAVENOUS | Status: DC | PRN
  Filled 2016-05-05: qty 10
  Filled 2016-05-05: qty 5

## 2016-05-05 NOTE — Progress Notes (Signed)
Notified of pt arrival in MAU and exam and discomfort.

## 2016-05-05 NOTE — Anesthesia Procedure Notes (Signed)
Epidural Patient location during procedure: OB  Staffing Anesthesiologist: Steward Sames Performed: anesthesiologist   Preanesthetic Checklist Completed: patient identified, pre-op evaluation, timeout performed, IV checked, risks and benefits discussed and monitors and equipment checked  Epidural Patient position: sitting Prep: site prepped and draped and DuraPrep Patient monitoring: heart rate Approach: midline Location: L3-L4 Injection technique: LOR air and LOR saline  Needle:  Needle type: Tuohy  Needle gauge: 17 G Needle length: 9 cm Needle insertion depth: 6 cm Catheter type: closed end flexible Catheter size: 19 Gauge Catheter at skin depth: 12 cm Test dose: negative  Assessment Sensory level: T8 Events: blood not aspirated, injection not painful, no injection resistance, negative IV test and no paresthesia  Additional Notes Reason for block:procedure for pain     

## 2016-05-05 NOTE — MAU Note (Signed)
Pt presents complaining of worsening contractions since 1800. Denies leaking or bleeding. Reports good fetal movement.

## 2016-05-05 NOTE — H&P (Signed)
LABOR AND DELIVERY ADMISSION HISTORY AND PHYSICAL NOTE  Joanna Melton is a 30 y.o. female [phone] with IUP at 6117w0d by 11wk 5 d US presenting for contractions and active labor.   Pt does have history of marijuana use and smoking in pregnancy.  She reports positive fetal movement. She denies leakage of fluid or vaginal bleeding.  Prenatal History/Complications:  Past Medical History: Past Medical History:  Diagnosis Date  . Anemia   . Anxiety   . Chlamydia   . Depression   . Medical history non-contributory     Past Surgical History: Past Surgical History:  Procedure Laterality Date  . INDUCED ABORTION      Obstetrical History: OB History    Gravida Para Term Preterm AB Living   6 3 3  0 2 3   SAB TAB Ectopic Multiple Live Births   1 1 0 0 3      Social History: Social History   Social History  . Marital status: Single    Spouse name: N/A  . Number of children: N/A  . Years of education: N/A   Social History Main Topics  . Smoking status: Current Every Day Smoker    Packs/day: 0.50    Years: 10.00    Types: Cigarettes  . Smokeless tobacco: Never Used     Comment: trying to cut back on smoking  . Alcohol use No     Comment: occasional  . Drug use:     Types: Marijuana     Comment: denies  . Sexual activity: Yes    Birth control/ protection: None   Other Topics Concern  . None   Social History Narrative  . None    Family History: Family History  Problem Relation Age of Onset  . Hypertension Maternal Grandfather   . Diabetes Maternal Grandfather   . Hypertension Mother     Allergies: No Known Allergies  Prescriptions Prior to Admission  Medication Sig Dispense Refill Last Dose  . acetaminophen (TYLENOL) 500 MG tablet Take 500 mg by mouth every 6 (six) hours as needed.   Taking  . Prenatal Vit-Fe Fumarate-FA (PRENATAL COMPLETE) 14-0.4 MG TABS Take 1 tablet by mouth daily. 60 each 6 Taking  . promethazine (PHENERGAN) 12.5 MG tablet Take  1 tablet (12.5 mg total) by mouth every 6 (six) hours as needed for nausea or vomiting. 20 tablet 0 Taking  . traMADol (ULTRAM) 50 MG tablet Take 1 tablet (50 mg total) by mouth every 6 (six) hours as needed. (Patient not taking: Reported on 04/03/2016) 6 tablet 0 Not Taking     Review of Systems   All systems reviewed and negative except as stated in HPI  Blood pressure 127/74, pulse 71, temperature 98 F (36.7 C), temperature source Oral, resp. rate 18, last menstrual period 08/08/2015, unknown if currently breastfeeding. General appearance: alert, cooperative and appears stated age Lungs: no respiratory distress Heart: regular rate distal pulses intact Abdomen: soft, non-tender; gravid Extremities: No calf swelling or tenderness Presentation: cephalic by nursing exam Fetal monitoring: category 1 Uterine activity: contractions every 5 minutes Dilation: 3.5 Effacement (%): 70 Station: -1 Exam by:: Sharen Hintaroline Brewer RNC   Prenatal labs: ABO, Rh: O/POS/-- (03/29 1432) Antibody: NEG (03/29 1432) Rubella: immune RPR: NON REAC (03/29 1432)  HBsAg: NEGATIVE (03/29 1432)  HIV: NONREACTIVE (03/29 1432)  GBS:   negative Genetic screening:  Quad normal Anatomy US: normal  Prenatal Transfer Tool  Maternal Diabetes: No Genetic Screening: Normal Maternal Ultrasounds/Referrals: Normal Fetal Ultrasounds  or other Referrals:  None Maternal Substance Abuse:  Yes:  Type: Marijuana Significant Maternal Medications:  None Significant Maternal Lab Results: Lab values include: Group B Strep negative  No results found for this or any previous visit (from the past 24 hour(s)).  Patient Active Problem List   Diagnosis Date Noted  . ASCUS with positive high risk HPV 12/26/2015  . Marijuana use 12/26/2015  . Late prenatal care affecting pregnancy in third trimester, antepartum 12/19/2015  . Anemia complicating pregnancy in third trimester 06/01/2013  . Smoker 05/03/2013     Assessment: Joanna HaysValerie M Lockamy is a 30 y.o. [id number] at 1958w0d here for spontaneous onset of labor.  #Labor:expect SVD #Pain: epdiural and IV fentanyl #FWB: Category 1 #ID:  gbs negative #MOF: bottle #MOC:nexplanon #Circ:  Yes outpt  Ernestina Pennaicholas Schenk 05/05/2016, 10:05 PM

## 2016-05-05 NOTE — Anesthesia Preprocedure Evaluation (Signed)
Anesthesia Evaluation  Patient identified by MRN, date of birth, ID band Patient awake    Reviewed: Allergy & Precautions, H&P , Patient's Chart, lab work & pertinent test results  Airway Mallampati: III  TM Distance: >3 FB Neck ROM: full    Dental no notable dental hx. (+) Teeth Intact   Pulmonary Current Smoker,    Pulmonary exam normal breath sounds clear to auscultation       Cardiovascular negative cardio ROS   Rhythm:regular Rate:Normal     Neuro/Psych negative neurological ROS  negative psych ROS   GI/Hepatic negative GI ROS, Neg liver ROS,   Endo/Other  negative endocrine ROS  Renal/GU negative Renal ROS  negative genitourinary   Musculoskeletal   Abdominal Normal abdominal exam  (+)   Peds  Hematology negative hematology ROS (+) anemia ,   Anesthesia Other Findings   Reproductive/Obstetrics (+) Pregnancy                             Anesthesia Physical  Anesthesia Plan  ASA: II  Anesthesia Plan: Epidural   Post-op Pain Management:    Induction:   Airway Management Planned:   Additional Equipment:   Intra-op Plan:   Post-operative Plan:   Informed Consent: I have reviewed the patients History and Physical, chart, labs and discussed the procedure including the risks, benefits and alternatives for the proposed anesthesia with the patient or authorized representative who has indicated his/her understanding and acceptance.     Plan Discussed with: Anesthesiologist  Anesthesia Plan Comments:         Anesthesia Quick Evaluation

## 2016-05-06 ENCOUNTER — Encounter (HOSPITAL_COMMUNITY): Payer: Self-pay

## 2016-05-06 LAB — CBC
HEMATOCRIT: 27.7 % — AB (ref 36.0–46.0)
HEMOGLOBIN: 9.6 g/dL — AB (ref 12.0–15.0)
MCH: 30.6 pg (ref 26.0–34.0)
MCHC: 34.7 g/dL (ref 30.0–36.0)
MCV: 88.2 fL (ref 78.0–100.0)
Platelets: 179 10*3/uL (ref 150–400)
RBC: 3.14 MIL/uL — ABNORMAL LOW (ref 3.87–5.11)
RDW: 14.7 % (ref 11.5–15.5)
WBC: 12.8 10*3/uL — ABNORMAL HIGH (ref 4.0–10.5)

## 2016-05-06 LAB — RPR: RPR Ser Ql: NONREACTIVE

## 2016-05-06 MED ORDER — TETANUS-DIPHTH-ACELL PERTUSSIS 5-2.5-18.5 LF-MCG/0.5 IM SUSP: 0.5000 mL | Freq: Once | INTRAMUSCULAR | Status: DC

## 2016-05-06 MED ORDER — DIBUCAINE 1 % RE OINT
1.0000 "application " | TOPICAL_OINTMENT | RECTAL | Status: DC | PRN
  Administered 2016-05-06: 1 via RECTAL
  Filled 2016-05-06: qty 28

## 2016-05-06 MED ORDER — ZOLPIDEM TARTRATE 5 MG PO TABS: 5.0000 mg | ORAL_TABLET | Freq: Every evening | ORAL | Status: DC | PRN

## 2016-05-06 MED ORDER — COCONUT OIL OIL: 1.0000 "application " | TOPICAL_OIL | Status: DC | PRN

## 2016-05-06 MED ORDER — SIMETHICONE 80 MG PO CHEW: 80.0000 mg | CHEWABLE_TABLET | ORAL | Status: DC | PRN

## 2016-05-06 MED ORDER — ACETAMINOPHEN 325 MG PO TABS: 650.0000 mg | ORAL_TABLET | ORAL | Status: DC | PRN

## 2016-05-06 MED ORDER — WITCH HAZEL-GLYCERIN EX PADS: 1.0000 "application " | MEDICATED_PAD | CUTANEOUS | Status: DC | PRN

## 2016-05-06 MED ORDER — ONDANSETRON HCL 4 MG PO TABS: 4.0000 mg | ORAL_TABLET | ORAL | Status: DC | PRN

## 2016-05-06 MED ORDER — PRENATAL MULTIVITAMIN CH
1.0000 | ORAL_TABLET | Freq: Every day | ORAL | Status: DC
  Administered 2016-05-06 – 2016-05-08 (×3): 1 via ORAL
  Filled 2016-05-06 (×3): qty 1

## 2016-05-06 MED ORDER — DIPHENHYDRAMINE HCL 25 MG PO CAPS: 25.0000 mg | ORAL_CAPSULE | Freq: Four times a day (QID) | ORAL | Status: DC | PRN

## 2016-05-06 MED ORDER — SENNOSIDES-DOCUSATE SODIUM 8.6-50 MG PO TABS
2.0000 | ORAL_TABLET | ORAL | Status: DC
  Administered 2016-05-07 (×2): 2 via ORAL
  Filled 2016-05-06 (×2): qty 2

## 2016-05-06 MED ORDER — IBUPROFEN 600 MG PO TABS
600.0000 mg | ORAL_TABLET | Freq: Four times a day (QID) | ORAL | Status: DC
  Administered 2016-05-06 – 2016-05-08 (×10): 600 mg via ORAL
  Filled 2016-05-06 (×10): qty 1

## 2016-05-06 MED ORDER — OXYCODONE-ACETAMINOPHEN 5-325 MG PO TABS
1.0000 | ORAL_TABLET | Freq: Three times a day (TID) | ORAL | Status: DC | PRN
  Administered 2016-05-06 – 2016-05-08 (×6): 1 via ORAL
  Filled 2016-05-06 (×6): qty 1

## 2016-05-06 MED ORDER — BENZOCAINE-MENTHOL 20-0.5 % EX AERO
1.0000 "application " | INHALATION_SPRAY | CUTANEOUS | Status: DC | PRN
  Administered 2016-05-06: 1 via TOPICAL
  Filled 2016-05-06: qty 56

## 2016-05-06 MED ORDER — ONDANSETRON HCL 4 MG/2ML IJ SOLN: 4.0000 mg | INTRAMUSCULAR | Status: DC | PRN

## 2016-05-06 NOTE — Progress Notes (Signed)
Plan of care:  Called to bedside as pt reports that she dislocated her L shoulder reaching for the remote. Prior to my arrival, she states it popped back in. She is currently without pain. She reports that this has been a problem for her in the past, and that she has been seen "over 20 times," [facility]. She was supposed to schedule a surgery, but she did not. She would like to follow up with the orthopedists after discharge. Physical exam: L shoulder passive ROM intact, pt hesitant to engage with active range of motion, sensation intact, pulse present, no brusing, atropy or asymmetry. Internal and external rotation intact, strength 5/5. Drop arm test negative. Will have patient call out if this happens again, otherwise will have the patient follow up with ortho after discharge.

## 2016-05-06 NOTE — Progress Notes (Signed)
UR chart review completed.  

## 2016-05-06 NOTE — Lactation Note (Signed)
This note was copied from a baby's chart. Lactation Consultation Note  Patient Name: Girl Vincent PeyerValerie Vigna ZOXWR'UToday's Date: 05/06/2016 Reason for consult: Initial assessment   Initial consult with Exp BF mom of 13 hour old infant. Infant with 2 BF for 10 minutes, 4 attempts, 1 void and 2 stools since birth. Infant 40w 1d GA with weight of 6 lb 5.4 oz.  Mom with a room full of visitors. Mom reports infant just finished feeding and was laying in mom's arms. She feels BF is going well and does not have any questions currently. She has tried to offer infant formula and infant would not drink it, she says she wants her to drink formula as she does not plan to breastfeed longer than 2 weeks. She declined assistance with feeding.  Infant rooted a little and mom latched her to the breast. Infant took nipple in mouth and fell back asleep. Mom reports there is some nipple pain with initial latch. Her breasts are pendulous and her nipples are a large diameter.   BF resources Handout and LC Brochure given to mom. Mom was informed of LC phone # and IP/OP Services. Enc mom to call for assistance as needed.    Maternal Data Formula Feeding for Exclusion: Yes Reason for exclusion: Mother's choice to formula and breast feed on admission Does the patient have breastfeeding experience prior to this delivery?: Yes  Feeding Feeding Type: Breast Fed Length of feed: 10 min  LATCH Score/Interventions                      Lactation Tools Discussed/Used WIC Program: No   Consult Status Consult Status: Follow-up Date: 05/07/16 Follow-up type: In-patient    Silas FloodSharon S Danniel Grenz 05/06/2016, 2:48 PM

## 2016-05-06 NOTE — Progress Notes (Signed)
Assisted pt up to bathroom to void, pt voided without difficulty. Peri care taught. Pt demonstrated back. Pt tolerated ambulation well, gait steady.

## 2016-05-07 DIAGNOSIS — Z3A40 40 weeks gestation of pregnancy: Secondary | ICD-10-CM

## 2016-05-07 DIAGNOSIS — O99324 Drug use complicating childbirth: Secondary | ICD-10-CM

## 2016-05-07 DIAGNOSIS — F17210 Nicotine dependence, cigarettes, uncomplicated: Secondary | ICD-10-CM

## 2016-05-07 DIAGNOSIS — O99334 Smoking (tobacco) complicating childbirth: Secondary | ICD-10-CM

## 2016-05-07 NOTE — Progress Notes (Signed)
POSTPARTUM PROGRESS NOTE  Post Partum Day 1 Subjective:  Joanna Melton is a 30 y.o. [id number] 4026w1d s/p SVD.  No acute events overnight.  Pt denies problems with ambulating, voiding or po intake.  She denies nausea or vomiting.  Pain is moderately controlled.  She has had flatus. She has not had bowel movement.  Lochia Small.   Objective: Blood pressure 136/70, pulse 66, temperature 98.6 F (37 C), temperature source Oral, resp. rate 18, height 5\' 1"  (1.549 m), weight 158 lb (71.7 kg), last menstrual period 08/08/2015, SpO2 98 %, unknown if currently breastfeeding.  Physical Exam:  General: alert, cooperative and no distress Lochia:normal flow Chest: no respiratory distress Heart: RRR no m/r/g Abdomen: +BS, soft, nontender,  Uterine Fundus: firm, appropriately tender DVT Evaluation: No calf swelling or tenderness Extremities: trace edema   Recent Labs  05/05/16 2240 05/06/16 0524  HGB 11.0* 9.6*  HCT 32.1* 27.7*    Assessment/Plan:  ASSESSMENT: Joanna Melton is a 30 y.o. [id number] 3226w1d s/p SVD  Plan for discharge tomorrow and Social Work consult   LOS: 2 days   Ernestina Pennaicholas Schenk 05/07/2016, 11:52 AM

## 2016-05-07 NOTE — Progress Notes (Signed)
Post Partum Day 1 Subjective: up ad lib, voiding and tolerating PO, having Increased cramping and pain, has been taking Motrin and had 1 percocet last evening. Breast and bottle feeding, Does not plan on BF for long but baby keeps wanting her breasts.   Objective: Blood pressure 136/70, pulse 66, temperature 98.6 F (37 C), temperature source Oral, resp. rate 18, height 5\' 1"  (1.549 m), weight 71.7 kg (158 lb), last menstrual period 08/08/2015, SpO2 98 %, unknown if currently breastfeeding.  Physical Exam:  General: alert, cooperative, appears stated age and no distress Lochia: appropriate Uterine Fundus: firm Incision: N/A DVT Evaluation: No evidence of DVT seen on physical exam.   Recent Labs  05/05/16 2240 05/06/16 0524  HGB 11.0* 9.6*  HCT 32.1* 27.7*    Assessment/Plan: Plan for discharge tomorrow, Social Work consult and Contraception Questions answered regarding LARCS, Desires Nexplanon   LOS: 2 days   Stacie Diette 05/07/2016, 7:50 AM   OB FELLOW MEDICAL STUDENT NOTE ATTESTATION  I have seen and examined this patient. Note this is a Psychologist, occupationalmedical student note and as such does not necessarily reflect the patient's plan of care. Please see Dr. Avelina LaineSchenk's progress note for this date of service.    Ernestina Pennaicholas Julyanna Scholle 05/07/2016, 1:57 PM

## 2016-05-07 NOTE — Clinical Social Work Maternal (Signed)
CLINICAL SOCIAL WORK MATERNAL/CHILD NOTE  Patient Details  Name: LAVEDA DEMEDEIROS MRN: [id number] Date of Birth: [birth year]  Date:  05/07/2016  Clinical Social Worker Initiating Note:  Lilly Cove, [place] Date/ Time Initiated:  05/07/16/1007     Child's Name:  Tressie Stalker  [facility] Elvis Coil)   Legal Guardian:  Mother   Need for Interpreter:  None   Date of Referral:  05/07/16     Reason for Referral:  Current Substance Use/Substance Use During Pregnancy  (Assessment of needs)   Referral Source:  RN   Address:  [address] [place] [address]  Phone number:      Household Members:  Minor Children, Parents (lives with her mother)   Natural Supports (not living in the home):  Extended Family, Friends, Spouse/significant other   Professional Supports: None   Will be referred to [employer] and [employer]  Employment: Unemployed   Type of Work: reports she has some jobs lined up   Education:  9 to 11 years   Museum/gallery curator Resources:  [employer]   Other Resources:  [employer], Physicist, medical  (will be applying for [employer])   Cultural/Religious Considerations Which May Impact Care:  none reported  Strengths:  Ability to meet basic needs , Compliance with medical plan , Pediatrician chosen    Risk Factors/Current Problems:  Basic Needs , DHHS Involvement , Substance Use , Other (Comment) (housing)   Cognitive State:  Able to Concentrate , Alert , Goal Oriented    Mood/Affect:  Bright , Comfortable , Interested    CSW Assessment: LCSW met with MOB alone in room with infant.  MOB was bonding with baby doing skin to skin and was open to assessment.  She reports child's name is Aet[employer] and will take father and mother's last names.  She reports this is her fourth child, others include: 28 year old:  43, 28 year old: Royal and 28 year old: Edison Pace.  She reports she has custody of all her children.  MOB educated on reason for consult and SW role while in hospital.  LCSW explained  drug policy and use of substances in pregnancy warrants report, however newborn did test positive for [facility], thus this is an automatic CPS report.  MOB was very open, transparent with information reporting her last use was 3 weeks ago due to being unable to eat and have an appetite.  She reports she told MDs in prenatal care and they never really said anything. She reports she did not use daily, but she did use throughout pregnancy.  She reports no other drug or alcohol use. MOB is visibly upset with self and reports she never intended to hurt baby and did not think baby would have it in her system. She is appolgetic and reports she wants to parent this child and understands CPS involvement as she reports she has had CPS involved in the past with her other three.  She reports CPS removed her 2 oldest for 6 months due to a head injury when the children were not in her care and she had them stay with her mother when she did not have adequate resources to care for children.  Her third child had CPS called due to hx of SA use and custody loss in the past.  She reports the child was discharged with her and CPS followed up at home.  She denies any CPS involvement at this time. Her biggest issues currently are stable housing and finding work. MOB has  been living and plans to live with her mother where her children attend school and they all live until MOB can find housing for self.  FOB is very involved, she reports he is Hispanic and he works full time in Investment banker, operational stone. He lives in [place] with his family and they do not intend to live together, but have been friends and dating for the last 4 years. She reports he financially supports her and has bought numerous items for the baby including a new car seat.  She reports he also helps take care of other three children as FOB of first two is somewhat involved, but not financially, just takes kids to lunch or on an outing once a month.  She reports she is open to  resources and mentions in assessment she is lacking a bed for baby. She was going to put baby in bed with her LCSW educated MOB on safe sleep/SIDS in which she was aware, but had no alternative plan.  LCSW was able to get a bassinet for MOB from [employer] and [facility] [facility].  Also MOB was educated on [facility] and she was accepting on resources and allowing LCSW to make referral.  She denies need for SA resources, however they were offered to her.  LCSW encouraged MOB to get into som sort of therapy to learn positive and healthy coping skills to address toxic stress affecting sleep and appetite.    No other needs reported by MOB at this time.  LCSW will complete referral and report to CPS and [place].  CPS to follow up with LCSW if case accepted prior to DC and if not they will follow up in home if screened in.  MOB was very appreciative and engaged in assessment of needs.  CSW Plan/Description:  Information/Referral to [employer]:  MOB agreeable to [employer] referral and [facility] [facility] (MOB given a bassinet due to no bed for baby) also given information about Network engineer ,  Patient/Family Education: Educated on [employer], safe sleep, PPD ,  Child Protective Service Report:report made to [employer] due to positive tox screen of infant: [facility]    No Further Intervention Required/No Barriers to Discharge :  LCSW will follow cord and send to CPS once labs are available, CPS will follow up with MOB at home.   Lilly Cove, LCSW 05/07/2016, 10:27 AM

## 2016-05-08 ENCOUNTER — Encounter: Payer: Medicaid Other | Admitting: Family

## 2016-05-08 ENCOUNTER — Other Ambulatory Visit: Payer: Medicaid Other | Admitting: Family Medicine

## 2016-05-08 MED ORDER — OXYCODONE-ACETAMINOPHEN 5-325 MG PO TABS: 1.0000 | ORAL_TABLET | Freq: Three times a day (TID) | ORAL | 0 refills | Status: DC | PRN

## 2016-05-08 MED ORDER — IBUPROFEN 600 MG PO TABS: 600.0000 mg | ORAL_TABLET | Freq: Four times a day (QID) | ORAL | 0 refills | Status: DC

## 2016-05-08 NOTE — Discharge Summary (Signed)
OB Discharge Summary     Patient Name: Joanna Melton DOB: 06/08/1986 MRN: [id number]  Date of admission: 05/05/2016 Delivering MD: Ernestina PennaSCHENK, NICHOLAS MICHAEL   Date of discharge: 05/08/2016  Admitting diagnosis: 40w labor Intrauterine pregnancy: 5753w1d     Secondary diagnosis:  Active Problems:   Active labor at term  Additional problems: None     Discharge diagnosis: Term Pregnancy Delivered                                                                                                Post partum procedures:None  Augmentation: None  Complications: None  Hospital course:  Onset of Labor With Vaginal Delivery     30 y.o. yo [id number] at 5253w1d was admitted in Active Labor on 05/05/2016. Patient had an uncomplicated labor course as follows:  Membrane Rupture Time/Date: 1:05 AM ,05/06/2016   Intrapartum Procedures: Episiotomy: None [1]                                         Lacerations:  Labial [10]  Patient had a delivery of a Viable infant. 05/06/2016  Information for the patient's newborn:  Aldean AstKimbrough, Girl Vikki PortsValerie [[id number]][[id number]]  Delivery Method: CS-LTranv    Pateint had an uncomplicated postpartum course.  She is ambulating, tolerating a regular diet, passing flatus, and urinating well. Patient is discharged home in stable condition on 05/08/16.    Physical exam Vitals:   05/07/16 0526 05/07/16 1728 05/08/16 0111 05/08/16 0630  BP: 136/70 118/78 (!) 146/73 129/60  Pulse: 66 82 65 79  Resp: 18 18 18 18   Temp: 98.6 F (37 C) 98.5 F (36.9 C) 98.7 F (37.1 C) 98.2 F (36.8 C)  TempSrc: Oral Oral Oral   SpO2:      Weight:      Height:       General: alert, cooperative and no distress Lochia: appropriate Uterine Fundus: firm Incision: N/A DVT Evaluation: No evidence of DVT seen on physical exam. Negative Homan's sign. No cords or calf tenderness. Labs: Lab Results  Component Value Date   WBC 12.8 (H) 05/06/2016   HGB 9.6 (L) 05/06/2016   HCT 27.7 (L)  05/06/2016   MCV 88.2 05/06/2016   PLT 179 05/06/2016   CMP Latest Ref Rng & Units 11/20/2014  Glucose 70 - 99 mg/dL 89  BUN 6 - 23 mg/dL 5(L)  Creatinine 1.300.50 - 1.10 mg/dL 8.651.05  Sodium 784135 - 696145 mmol/L 136  Potassium 3.5 - 5.1 mmol/L 3.3(L)  Chloride 96 - 112 mmol/L 106  CO2 19 - 32 mmol/L 22  Calcium 8.4 - 10.5 mg/dL 9.0  Total Protein 6.0 - 8.3 g/dL 7.3  Total Bilirubin 0.3 - 1.2 mg/dL 0.5  Alkaline Phos 39 - 117 U/L 78  AST 0 - 37 U/L 19  ALT 0 - 35 U/L 12    Discharge instruction: per After Visit Summary and "Baby and Me Booklet".  After visit meds:    Medication List    STOP taking  these medications   calcium carbonate 500 MG chewable tablet Commonly known as:  TUMS - dosed in mg elemental calcium   promethazine 12.5 MG tablet Commonly known as:  PHENERGAN     TAKE these medications   acetaminophen 500 MG tablet Commonly known as:  TYLENOL Take 500 mg by mouth every 6 (six) hours as needed for moderate pain.   ibuprofen 600 MG tablet Commonly known as:  ADVIL,MOTRIN Take 1 tablet (600 mg total) by mouth every 6 (six) hours.   oxyCODONE-acetaminophen 5-325 MG tablet Commonly known as:  PERCOCET/ROXICET Take 1 tablet by mouth every 8 (eight) hours as needed for moderate pain.   PRENATAL COMPLETE 14-0.4 MG Tabs Take 1 tablet by mouth daily.       Diet: routine diet  Activity: Advance as tolerated. Pelvic rest for 6 weeks.   Outpatient follow up:6 weeks Follow up Appt:No future appointments. Follow up Visit:No Follow-up on file.  Postpartum contraception: Nexplanon  Newborn Data: Live born female  Birth Weight: 6 lb 5.4 oz (2875 g) APGAR: 8, 9  Baby Feeding: Bottle Disposition:home with mother   05/08/2016 Jen MowElizabeth Rashawn Rayman, DO

## 2016-05-08 NOTE — Lactation Note (Signed)
This note was copied from a baby's chart. Lactation Consultation Note: Mom reports breasts are feeling much heavy through the night. Pumped at 3 am and obtained 4 oz. States she does not plan to breast feed very much. Reviewed engorgement prevention and treatment. Does not have pump for home and does not have WIC. Manual pump given with instructions for setup, use and cleaning. No questions at present. To call prn  Patient Name: Joanna Vincent PeyerValerie Pondexter ZOXWR'UToday's Date: 05/08/2016 Reason for consult: Follow-up assessment   Maternal Data Formula Feeding for Exclusion: Yes Reason for exclusion: Mother's choice to formula and breast feed on admission  Feeding Feeding Type: Bottle Fed - Breast Milk Length of feed: 15 min  LATCH Score/Interventions          Comfort (Breast/Nipple): Filling, red/small blisters or bruises, mild/mod discomfort  Problem noted: Filling        Lactation Tools Discussed/Used WIC Program: No   Consult Status Consult Status: Complete    Pamelia HoitWeeks, Gisell Buehrle D 05/08/2016, 8:45 AM

## 2016-05-08 NOTE — Discharge Instructions (Signed)

## 2016-05-08 NOTE — Plan of Care (Signed)
Problem: Nutritional: Goal: Mother's verbalization of comfort with breastfeeding process will improve Outcome: Completed/Met Date Met: 05/08/16 Pt inconsistent with breastfeeding.  Sometimes when asked she states she wants to breastfeed, other times she wants to pump, other times she wants to bottle only.  Pt taught engorgement, pumping and how to decrease stimulation to nipples and milk supply should she decided to no longer breastfeed or pump.

## 2016-05-08 NOTE — Discharge Summary (Signed)
OB Discharge Summary     Patient Name: Joanna Melton DOB: 07/26/1986 MRN: [id number]  Date of admission: 05/05/2016 Delivering MD: Ernestina PennaSCHENK, NICHOLAS MICHAEL   Date of discharge: 05/08/2016  Admitting diagnosis: 40w labor Intrauterine pregnancy: 2156w1d     Secondary diagnosis:  Active Problems:   Active labor at term  Additional problems: Abruption on placental path. 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      Discharge diagnosis: Term Pregnancy Delivered                                                                                                Post partum procedures:none  Augmentation: none  Complications: Placental Abruption on pathology.  Hospital course:  Onset of Labor With Vaginal Delivery     30 y.o. yo [id number] at 5056w1d was admitted in Active Labor on 05/05/2016. Patient had an uncomplicated labor course as follows:  Membrane Rupture Time/Date: 1:05 AM ,05/06/2016   Intrapartum Procedures: Episiotomy: None [1]                                         Lacerations:  Labial [10]  Patient had a delivery of a Viable infant. 05/06/2016  Information for the patient's newborn:  Aldean AstKimbrough, Girl Vikki PortsValerie [[id number]][[id number]]  Delivery Method: CS-LTranv    Pateint had an uncomplicated postpartum course.  She is ambulating, tolerating a regular diet, passing flatus, and urinating well. Patient is discharged home in stable condition on 05/08/16.    Physical exam  Vitals:   05/07/16 0526 05/07/16 1728 05/08/16 0111 05/08/16 0630  BP: 136/70 118/78 (!) 146/73 129/60  Pulse: 66 82 65 79  Resp: 18 18 18 18   Temp: 98.6 F (37 C) 98.5 F (36.9 C) 98.7 F (37.1 C) 98.2 F (36.8 C)  TempSrc: Oral Oral Oral   SpO2:      Weight:      Height:       General: alert, cooperative and no distress Lochia: appropriate Uterine Fundus: firm DVT Evaluation: No evidence of DVT seen on physical exam. Negative Homan's sign. Labs: Lab Results  Component Value Date   WBC 12.8 (H)  05/06/2016   HGB 9.6 (L) 05/06/2016   HCT 27.7 (L) 05/06/2016   MCV 88.2 05/06/2016   PLT 179 05/06/2016   CMP Latest Ref Rng & Units 11/20/2014  Glucose 70 - 99 mg/dL 89  BUN 6 - 23 mg/dL 5(L)  Creatinine 1.300.50 - 1.10 mg/dL 8.651.05  Sodium 784135 - 696145 mmol/L 136  Potassium 3.5 - 5.1 mmol/L 3.3(L)  Chloride 96 - 112 mmol/L 106  CO2 19 - 32 mmol/L 22  Calcium 8.4 - 10.5 mg/dL 9.0  Total Protein 6.0 - 8.3 g/dL 7.3  Total Bilirubin 0.3 - 1.2 mg/dL 0.5  Alkaline Phos 39 - 117 U/L 78  AST 0 - 37 U/L 19  ALT 0 - 35 U/L 12    Discharge instruction: per After Visit Summary and "Baby and Me Booklet".  After visit meds:  Medication List    STOP taking these medications   calcium carbonate 500 MG chewable tablet Commonly known as:  TUMS - dosed in mg elemental calcium   promethazine 12.5 MG tablet Commonly known as:  PHENERGAN     TAKE these medications   acetaminophen 500 MG tablet Commonly known as:  TYLENOL Take 500 mg by mouth every 6 (six) hours as needed for moderate pain.   ibuprofen 600 MG tablet Commonly known as:  ADVIL,MOTRIN Take 1 tablet (600 mg total) by mouth every 6 (six) hours.   oxyCODONE-acetaminophen 5-325 MG tablet Commonly known as:  PERCOCET/ROXICET Take 1 tablet by mouth every 8 (eight) hours as needed for moderate pain.   PRENATAL COMPLETE 14-0.4 MG Tabs Take 1 tablet by mouth daily.       Diet: routine diet  Activity: Advance as tolerated. Pelvic rest for 6 weeks.   Outpatient follow up:6 weeks Follow up Appt:No future appointments. Follow up Visit: Follow-up Information    [facility] Schedule an appointment as soon as possible for a visit in 6 week(s).   Specialty:  Obstetrics and Gynecology Why:  for postpartum visit Contact information: [address] [place] [phone]       [facility]   Why:  As needed. For any potential emergencies  Contact  information: [address] [place] [phone]         Postpartum contraception: Nexplanon  Newborn Data: Live born female  Birth Weight: 6 lb 5.4 oz (2875 g) APGAR: 8, 9  Baby Feeding: Bottle Disposition:home with mother, CPS will follow up with MOB at home.    05/08/2016 Leland HerElsia J Yoo, DO   OB FELLOW DISCHARGE ATTESTATION  I have seen and examined this patient and agree with above documentation in the resident's note.   Jen MowElizabeth Mumaw, DO OB Fellow

## 2016-05-08 NOTE — Lactation Note (Signed)
This note was copied from a baby's chart. Lactation Consultation Note: asked by RN to go back to room to review engorgement care with mom. Mom does not plan to breast feed her baby. She has been pumping every 3-4 hours and last pumping obtained 3.5 oz. Reviewed as she pumps and empties breast her body will make more milk. Encouraged ice and cabbage leaves. To pump only to comfort not to empty breast. Mom has manual pump for home. States she understands supply and demand. No questions at present.  Patient Name: Joanna Vincent PeyerValerie Chern WUJWJ'XToday's Date: 05/08/2016     Maternal Data    Feeding    LATCH Score/Interventions                      Lactation Tools Discussed/Used     Consult Status      Pamelia HoitWeeks, Leina Babe D 05/08/2016, 2:55 PM

## 2016-05-08 NOTE — Progress Notes (Signed)
Pt taught about engorgement.  Pt instructed to only pump if she if very uncomfortable and to not remove much milk.  Pt given ice packs and cabbage leaves.  Education reinforced by lactation.  When RN went back into room, the pt informed the RN that she needed to pump more and had pumped off a total of 6 ounces of breast milk. Pt educated again.

## 2016-05-09 ENCOUNTER — Inpatient Hospital Stay (HOSPITAL_COMMUNITY)
Admission: AD | Admit: 2016-05-09 | Discharge: 2016-05-09 | Disposition: A | Discharge status: Home / Self Care | Payer: Medicaid Other | Source: Ambulatory Visit | Attending: Obstetrics & Gynecology | Admitting: Obstetrics & Gynecology

## 2016-05-09 ENCOUNTER — Encounter (HOSPITAL_COMMUNITY): Payer: Self-pay | Admitting: *Deleted

## 2016-05-09 DIAGNOSIS — F17210 Nicotine dependence, cigarettes, uncomplicated: Secondary | ICD-10-CM | POA: Insufficient documentation

## 2016-05-09 DIAGNOSIS — F329 Major depressive disorder, single episode, unspecified: Secondary | ICD-10-CM | POA: Diagnosis not present

## 2016-05-09 DIAGNOSIS — F419 Anxiety disorder, unspecified: Secondary | ICD-10-CM | POA: Diagnosis not present

## 2016-05-09 DIAGNOSIS — N6459 Other signs and symptoms in breast: Secondary | ICD-10-CM | POA: Insufficient documentation

## 2016-05-09 DIAGNOSIS — O9279 Other disorders of lactation: Secondary | ICD-10-CM

## 2016-05-09 DIAGNOSIS — Z9889 Other specified postprocedural states: Secondary | ICD-10-CM | POA: Diagnosis not present

## 2016-05-09 DIAGNOSIS — M25552 Pain in left hip: Secondary | ICD-10-CM | POA: Diagnosis not present

## 2016-05-09 DIAGNOSIS — N644 Mastodynia: Secondary | ICD-10-CM | POA: Insufficient documentation

## 2016-05-09 DIAGNOSIS — Z79899 Other long term (current) drug therapy: Secondary | ICD-10-CM | POA: Insufficient documentation

## 2016-05-09 MED ORDER — OXYCODONE-ACETAMINOPHEN 5-325 MG PO TABS
1.0000 | ORAL_TABLET | Freq: Once | ORAL | Status: AC
  Administered 2016-05-09: 1 via ORAL
  Filled 2016-05-09: qty 1

## 2016-05-09 MED ORDER — IBUPROFEN 800 MG PO TABS
800.0000 mg | ORAL_TABLET | Freq: Once | ORAL | Status: AC
  Administered 2016-05-09: 800 mg via ORAL
  Filled 2016-05-09: qty 1

## 2016-05-09 NOTE — Discharge Instructions (Signed)
Breast Engorgement °Breast engorgement is the overfilling of your breasts with breast milk. In the first few weeks after giving birth, you may experience breast engorgement. Although it is normal for your breasts to feel heavy, full, and uncomfortable within 3-5 days of giving birth, breast engorgement can make your breasts throb and feel hard, tightly stretched, warm, and tender. Engorgement peaks about the fifth day after you give birth. Breast engorgement can be easily treated and does not require you to stop breastfeeding.  °CAUSES OF BREAST ENGORGEMENT °Some women delay feedings because of sore or cracked nipples, which can lead to engorgement. Cracked and sore nipples often are caused by inadequate latching (when your baby's mouth attaches to your breast to breastfeed). If your baby is latched on properly, he or she should be able to breastfeed as long as needed, without causing any pain. If you do feel pain while breastfeeding, take your baby off your breast and try again. Get help from your health care provider or a lactation consultant if you continue to have pain. °Other causes of engorgement include:  °· Improper position of your baby while breastfeeding. °· Allowing too much time to pass between feedings. °· Reduction in breastfeeding because you give your baby water, juice, formula, breast milk from a bottle, or a pacifier instead of breastfeeding. °· Changes in your baby's feeding patterns. °· Weak sucking from your baby, which causes less milk to be taken out of your breast during feedings. °· Fatigue, stress, anemia. °· Plugged milk ducts. °· A history of breast surgery. °SIGNS AND SYMPTOMS OF BREAST ENGORGEMENT °If your breasts become engorged, you may experience:  °· Breast swelling, tenderness, warmth, redness, or throbbing. °· Breast hardness and stretching of the skin around your breast. °· Flattening, tightening, and hardening of your nipple. °· A low-grade fever, which can be confused with a  breast infection. °RECOMMENDATIONS TO EASE BREAST ENGORGEMENT °Breast engorgement should improve in 24-48 hours after following these recommendations: °· Breastfeed when you feel the need to reduce the fullness of your breasts or when your baby shows signs of hunger. This is called "breastfeeding on demand." °· Newborns (babies younger than 4 weeks) often breastfeed every 1-3 hours during the day. You may need to awaken your baby to feed if he or she is asleep at a feeding time. °· Do not allow your baby to sleep longer than 5 hours during the night without a feeding. °· Pump or hand-express breast milk before breastfeeding to soften your breast, areola, and nipple.   °· Apply warm, moist heat (in the shower or with warm water-soaked hand towels) just before feeding or pumping, or massage your breast before or during breastfeeding. This increases circulation and helps your milk to flow. °· Completely empty your breasts when breastfeeding or pumping. Afterward, wear a snug bra (nursing or regular) or tank top for 1-2 days to signal your body to slightly decrease milk production. Only wear snug bras or tank tops to treat engorgement. Tight bras typically should be avoided by breastfeeding mothers. Once engorgement is relieved, return to wearing regular, loose-fitting clothes. °· Apply ice packs to your breasts to lessen the pain from engorgement and relieve swelling, unless the ice is uncomfortable for you. °· Do not delay feedings. Try to relax when it is time to feed your baby. This helps to trigger your "let-down reflex," which releases milk from your breast. °· Ensure your baby is latched on to your breast and positioned properly while breastfeeding.   °· Allow   your baby to remain at your breast as long as he or she is latched on well and actively sucking. Your baby will let you know when he or she is done breastfeeding by pulling away from your breast or falling asleep. °· Avoid introducing bottles or pacifiers  to your baby in the early weeks of breastfeeding. Wait to introduce these things until after resolving any breastfeeding challenges. °· Try to pump your milk on the same schedule as when your baby would breastfeed if you are returning to work or away from home for an extended period. °· Drink plenty of fluids to avoid dehydration, which can eventually put you at greater risk of breast engorgement.   °CALL YOUR HEALTH CARE PROVIDER OR LACTATION CONSULTANT IF:  °· Engorgement lasts longer than 2 days, even after treatment. °· You have flu-like symptoms, such as a fever, chills, or body aches. °· Your breasts become increasingly red and painful. °  °This information is not intended to replace advice given to you by your health care provider. Make sure you discuss any questions you have with your health care provider. °  °Document Released: 01/03/2005 Document Revised: 09/29/2014 Document Reviewed: 03/03/2013 °Elsevier Interactive Patient Education ©2016 [employer] Inc[employer] ° °

## 2016-05-09 NOTE — MAU Note (Signed)
Pt reports s/p vaginal delivery and [facility]. Presents with complaints of pain in her upper legs and thighs and bilateral breast pain and engorged.

## 2016-05-09 NOTE — MAU Provider Note (Signed)
History     CSN: [id number]  Arrival date and time: 05/09/16 [id number]   First Provider Initiated Contact with Patient 05/09/16 [phone]      Chief Complaint  Patient presents with  . Breast Pain  . Hip Pain   Joanna Melton is a 30 y.o. [id number] who is S/P NSVD on 05/06/16. She went home on 05/08/16. She states that she did not pick any of her prescriptions, and has not taken any pain medication today. She now has breast pain and hip pain. She states that it feels like her hips are "loose". She rates her pain 10/10 at this time. She reports that her breasts and very full, and "lumpy". She was trying to breastfeed, but now she is only doing bottles. She has not pumped or given the baby the breast today. She has been using cabbage leaves on her breast, but is has not helped the pain.      Past Medical History:  Diagnosis Date  . Anemia   . Anxiety   . Chlamydia   . Depression   . Medical history non-contributory     Past Surgical History:  Procedure Laterality Date  . INDUCED ABORTION      Family History  Problem Relation Age of Onset  . Hypertension Maternal Grandfather   . Diabetes Maternal Grandfather   . Hypertension Mother     Social History  Substance Use Topics  . Smoking status: Current Every Day Smoker    Packs/day: 0.50    Years: 10.00    Types: Cigarettes  . Smokeless tobacco: Never Used     Comment: trying to cut back on smoking  . Alcohol use No     Comment: occasional    Allergies: No Known Allergies  Prescriptions Prior to Admission  Medication Sig Dispense Refill Last Dose  . acetaminophen (TYLENOL) 500 MG tablet Take 500 mg by mouth every 6 (six) hours as needed for moderate pain.    Past Week at Unknown time  . ibuprofen (ADVIL,MOTRIN) 600 MG tablet Take 1 tablet (600 mg total) by mouth every 6 (six) hours. 30 tablet 0   . oxyCODONE-acetaminophen (PERCOCET/ROXICET) 5-325 MG tablet Take 1 tablet by mouth every 8 (eight) hours as needed for moderate  pain. 10 tablet 0   . Prenatal Vit-Fe Fumarate-FA (PRENATAL COMPLETE) 14-0.4 MG TABS Take 1 tablet by mouth daily. 60 each 6 Past Week at Unknown time    Review of Systems  Constitutional: Negative for chills and fever.  Gastrointestinal: Negative for abdominal pain, constipation, diarrhea, nausea and vomiting.  Genitourinary: Negative for dysuria, frequency and urgency.   Physical Exam   Blood pressure 130/79, pulse 81, temperature 98.5 F (36.9 C), temperature source Oral, resp. rate 20, SpO2 99 %, unknown if currently breastfeeding.  Physical Exam  Nursing note and vitals reviewed. Constitutional: She is oriented to person, place, and time. She appears well-developed and well-nourished. No distress.  HENT:  Head: Normocephalic.  Respiratory: Effort normal. Right breast exhibits nipple discharge and tenderness (breasts are very engorged. ). Right breast exhibits no inverted nipple, no mass and no skin change. Left breast exhibits nipple discharge and tenderness (breasts are very engorged. ). Left breast exhibits no inverted nipple, no mass and no skin change.  GI: Soft. There is no tenderness.  Neurological: She is alert and oriented to person, place, and time.  Skin: Skin is warm and dry.  Psychiatric: She has a normal mood and affect.    MAU Course  Procedures  MDM  Patient given ibuprofen and percocet here. She reports that she is feeling better now.  Assessment and Plan   1. Engorgement of breast associated with childbirth, postpartum    DC home Comfort measures reviewed  RX: no new RX advised to pick up prescriptions she has already been given.  Return to MAU as needed FU with OB as planned  Follow-up Information    [facility]   Specialty:  Obstetrics and Gynecology Contact information: [address] [place] [phone]           Tawnya CrookHogan, Steffie Waggoner Donovan 05/09/2016, 3:14 AM

## 2016-05-10 NOTE — Anesthesia Postprocedure Evaluation (Signed)
Anesthesia Post Note  Patient: Joanna Melton  Procedure(s) Performed: * No procedures listed *  Patient location during evaluation: Mother Baby Anesthesia Type: Epidural Level of consciousness: awake and alert Pain management: pain level controlled Vital Signs Assessment: post-procedure vital signs reviewed and stable Respiratory status: spontaneous breathing, nonlabored ventilation and respiratory function stable Cardiovascular status: stable Postop Assessment: no headache, no backache and epidural receding Anesthetic complications: no    Last Vitals: There were no vitals filed for this visit.  Last Pain: There were no vitals filed for this visit.               Lewie LoronJohn Ngina Royer

## 2016-05-14 ENCOUNTER — Encounter: Payer: Self-pay | Admitting: *Deleted

## 2016-05-17 ENCOUNTER — Inpatient Hospital Stay (HOSPITAL_COMMUNITY)
Admission: AD | Admit: 2016-05-17 | Discharge: 2016-05-17 | Disposition: A | Discharge status: Home / Self Care | Payer: Medicaid Other | Source: Ambulatory Visit | Attending: Obstetrics & Gynecology | Admitting: Obstetrics & Gynecology

## 2016-05-17 ENCOUNTER — Encounter (HOSPITAL_COMMUNITY): Payer: Self-pay | Admitting: *Deleted

## 2016-05-17 DIAGNOSIS — Z833 Family history of diabetes mellitus: Secondary | ICD-10-CM | POA: Diagnosis not present

## 2016-05-17 DIAGNOSIS — O872 Hemorrhoids in the puerperium: Secondary | ICD-10-CM | POA: Diagnosis not present

## 2016-05-17 DIAGNOSIS — Z8249 Family history of ischemic heart disease and other diseases of the circulatory system: Secondary | ICD-10-CM | POA: Insufficient documentation

## 2016-05-17 DIAGNOSIS — F17210 Nicotine dependence, cigarettes, uncomplicated: Secondary | ICD-10-CM | POA: Diagnosis not present

## 2016-05-17 DIAGNOSIS — F418 Other specified anxiety disorders: Secondary | ICD-10-CM | POA: Insufficient documentation

## 2016-05-17 DIAGNOSIS — K5901 Slow transit constipation: Secondary | ICD-10-CM | POA: Diagnosis not present

## 2016-05-17 DIAGNOSIS — K640 First degree hemorrhoids: Secondary | ICD-10-CM | POA: Diagnosis not present

## 2016-05-17 DIAGNOSIS — Z8619 Personal history of other infectious and parasitic diseases: Secondary | ICD-10-CM | POA: Insufficient documentation

## 2016-05-17 DIAGNOSIS — M25552 Pain in left hip: Secondary | ICD-10-CM | POA: Insufficient documentation

## 2016-05-17 MED ORDER — POLYETHYLENE GLYCOL 3350 17 G PO PACK: 17.0000 g | PACK | Freq: Two times a day (BID) | ORAL | 0 refills | Status: AC

## 2016-05-17 MED ORDER — POLYETHYLENE GLYCOL 3350 17 G PO PACK
17.0000 g | PACK | Freq: Every day | ORAL | Status: DC
  Administered 2016-05-17: 17 g via ORAL
  Filled 2016-05-17: qty 1

## 2016-05-17 NOTE — Progress Notes (Signed)
Written and verbal d/c instructions given and understanding voiced. Will go home and use Prep H and sleep awhile and then use enema. Take Miralax BID until having reg BMs and then QD as needed. Cont stool softeners 2-3times a day until having reg BMs and then daily

## 2016-05-17 NOTE — Progress Notes (Signed)
Few small pieces stool after first 100cc. 100cc more in

## 2016-05-17 NOTE — Progress Notes (Signed)
Donette LarryMelanie Bhambri CNM notified of results from enema and pt unable to tol much due to hemorrhoids. Pt wants to go home and try prep H and fleets later this am. Wants to get home to baby and has bday party today at 1400 for 2 of her children. OK to d/c pt home

## 2016-05-17 NOTE — Progress Notes (Signed)
Pt felt could tol alittle more so 150cc more instilled in addition to 100cc

## 2016-05-17 NOTE — MAU Provider Note (Signed)
History     CSN: [id number]  Arrival date and time: 05/17/16 0055   None     Chief Complaint  Patient presents with  . Abdominal Pain  . Hip Pain   [id number] post SVD 10 days ago c/o no BM in 2 weeks. She reports multiple attempts to have BM and feels pressure and something hard at her rectum. She tried taking castor oil yesterday with no relief. She also c/o hemorrhoids and is using cream that was given to her after delivery. She felt the hemorrhoids coming out of rectum today and she pushed them back in and felt hard stool. She is eating and drinking although admits to poor appetite. She denies N/V. She has been taking Ibuprofen and Percocet. She says since she delivered she feels her hips pop frequently and has joint pains.    Past Medical History:  Diagnosis Date  . Anemia   . Anxiety   . Chlamydia   . Depression   . Medical history non-contributory     Past Surgical History:  Procedure Laterality Date  . INDUCED ABORTION      Family History  Problem Relation Age of Onset  . Hypertension Maternal Grandfather   . Diabetes Maternal Grandfather   . Hypertension Mother     Social History  Substance Use Topics  . Smoking status: Current Every Day Smoker    Packs/day: 0.50    Years: 10.00    Types: Cigarettes  . Smokeless tobacco: Never Used     Comment: trying to cut back on smoking  . Alcohol use No     Comment: occasional    Allergies: No Known Allergies  Prescriptions Prior to Admission  Medication Sig Dispense Refill Last Dose  . acetaminophen (TYLENOL) 500 MG tablet Take 500 mg by mouth every 6 (six) hours as needed for moderate pain.    Past Month at Unknown time  . calcium carbonate (TUMS - DOSED IN MG ELEMENTAL CALCIUM) 500 MG chewable tablet Chew 1 tablet by mouth daily.   Past Month at Unknown time  . ibuprofen (ADVIL,MOTRIN) 600 MG tablet Take 1 tablet (600 mg total) by mouth every 6 (six) hours. 30 tablet 0 05/16/2016 at Unknown time  . Prenatal  Vit-Fe Fumarate-FA (PRENATAL COMPLETE) 14-0.4 MG TABS Take 1 tablet by mouth daily. 60 each 6 05/16/2016 at Unknown time  . oxyCODONE-acetaminophen (PERCOCET/ROXICET) 5-325 MG tablet Take 1 tablet by mouth every 8 (eight) hours as needed for moderate pain. 10 tablet 0     Review of Systems  Constitutional: Negative.   Gastrointestinal: Positive for abdominal pain and constipation.  Genitourinary: Negative.    Physical Exam   Blood pressure 124/80, pulse 63, temperature 98.5 F (36.9 C), resp. rate 18, height 5\' 1"  (1.549 m), weight 66.4 kg (146 lb 6.4 oz), currently breastfeeding.  Physical Exam  Constitutional: She is oriented to person, place, and time. She appears well-developed and well-nourished.  HENT:  Head: Normocephalic and atraumatic.  Neck: Normal range of motion.  Cardiovascular: Normal rate.   Respiratory: Effort normal.  GI: Soft. Bowel sounds are normal. She exhibits no distension and no mass. There is no tenderness. There is no rebound and no guarding.  Genitourinary:  Genitourinary Comments: External: small hemorrhoid x2, non-thrombosed Bimanual: uterus slightly enlarged, non-tender, cervix closed, no CMT, hard stool palpated through rectovaginal wall  Musculoskeletal: Normal range of motion.  Neurological: She is alert and oriented to person, place, and time.  Skin: Skin is warm and dry.  Psychiatric: She has a normal mood and affect.    MAU Course  Procedures Soap suds enema Miralax x1 dose  MDM Could only tolerate 350 ml of enema. Small amt of stool out after first 100. Pt requesting to go home. No evidence of ileus, likely constipation d/t narcotic use. Stable for discharge home.  Assessment and Plan   1. Slow transit constipation   2. First degree hemorrhoids    Discharge home Ibuprofen prn pain Prep-H cream to hemorrhoids TID (OTC) Miralax 17 g bid until BM Fleet enema prn Increase water and dietary fiber Follow up in [facility] as scheduled  Joanna Melton  Joanna Melton, CNM 05/17/2016, 2:01 AM

## 2016-05-17 NOTE — Progress Notes (Signed)
Pt could only tolerate small amt fld

## 2016-05-17 NOTE — MAU Note (Addendum)
No BM in 2 wks. Having pain in LLQ of abd, L hip and butt. Been taking stool softners. I do have hemorrhoids

## 2016-05-17 NOTE — Progress Notes (Signed)
M. Bhambri CNM notified of pt's admission and status. Will see pt 

## 2016-05-17 NOTE — Discharge Instructions (Signed)

## 2016-06-17 ENCOUNTER — Ambulatory Visit (INDEPENDENT_AMBULATORY_CARE_PROVIDER_SITE_OTHER): Payer: Medicaid Other | Admitting: Advanced Practice Midwife

## 2016-06-17 VITALS — BP 118/67 | Wt 138.0 lb

## 2016-06-17 DIAGNOSIS — Z3202 Encounter for pregnancy test, result negative: Secondary | ICD-10-CM

## 2016-06-17 DIAGNOSIS — R87610 Atypical squamous cells of undetermined significance on cytologic smear of cervix (ASC-US): Secondary | ICD-10-CM | POA: Diagnosis not present

## 2016-06-17 DIAGNOSIS — Z30017 Encounter for initial prescription of implantable subdermal contraceptive: Secondary | ICD-10-CM

## 2016-06-17 DIAGNOSIS — R87810 Cervical high risk human papillomavirus (HPV) DNA test positive: Secondary | ICD-10-CM

## 2016-06-17 LAB — POCT PREGNANCY, URINE: PREG TEST UR: NEGATIVE

## 2016-06-17 MED ORDER — IBUPROFEN 600 MG PO TABS: 600.0000 mg | ORAL_TABLET | Freq: Four times a day (QID) | ORAL | 1 refills | Status: AC

## 2016-06-17 MED ORDER — ETONOGESTREL 68 MG ~~LOC~~ IMPL
68.0000 mg | DRUG_IMPLANT | Freq: Once | SUBCUTANEOUS | Status: AC
  Administered 2016-06-17: 68 mg via SUBCUTANEOUS

## 2016-06-17 NOTE — Progress Notes (Signed)
Subjective:     Joanna Melton is a 30 y.o. female who presents for a postpartum visit. She is 6 weeks postpartum following a spontaneous vaginal delivery. I have fully reviewed the prenatal and intrapartum course. The delivery was at [id number]  gestational weeks. Outcome: spontaneous vaginal delivery. Anesthesia: epidural. Postpartum course has been uncomplicated. Baby's course has been uncomplicated. Baby is feeding by bottle - Carnation Good Start DHA and ARA and Similac Advance. Bleeding no bleeding. Bowel function is normal. Bladder function is normal. Patient is sexually active. Contraception method is condoms. Postpartum depression screening: negative.  The following portions of the patient's history were reviewed and updated as appropriate: allergies, current medications, past family history, past medical history, past social history, past surgical history and problem list.  Last Pap 11/2015 was ASCUS + HRHPV. No-showed for Colpo.  Review of Systems Pertinent items are noted in HPI.   Objective:    BP 118/67   Wt 138 lb (62.6 kg)   BMI 26.07 kg/m   General:  alert, cooperative, appears stated age and no distress   Breasts:  declined  Lungs: clear to auscultation bilaterally  Heart:  regular rate and rhythm, S1, S2 normal, no murmur, click, rub or gallop  Abdomen: soft, non-tender; bowel sounds normal; no masses,  no organomegaly   Vulva:  declined        Patient given informed consent, signed copy in the chart, time out was performed. Pregnancy test was negative. Appropriate time out taken.  Patient's right arm was prepped and draped in the usual sterile fashion.. The ruler used to measure and mark insertion area.  Pt was prepped with alcohol swab and then injected with 1 cc of 1% lidocaine.  Pt was prepped with betadine, Nexplanon removed form packaging,  Device confirmed in needle, then inserted full length of needle and withdrawn per handbook instructions. CNM and pt confirmed  placement of Nexplanon in arm. Pt insertion site covered with pressure dressing.   Minimal blood loss.  Pt tolerated the procedure well.   Assessment:     Normal postpartum exam. Pap smear not done at today's visit.   Plan:   1. Contraception: Nexplanon 2. Follow up in: ASAP for Colpo or as needed.

## 2016-06-17 NOTE — Patient Instructions (Addendum)
Etonogestrel implant  Use condoms first the first 7 days after insertion.   What is this medicine? ETONOGESTREL (et oh noe JES trel) is a contraceptive (birth control) device. It is used to prevent pregnancy. It can be used for up to 3 years. This medicine may be used for other purposes; ask your health care provider or pharmacist if you have questions. What should I tell my health care provider before I take this medicine? They need to know if you have any of these conditions: -abnormal vaginal bleeding -blood vessel disease or blood clots -cancer of the breast, cervix, or liver -depression -diabetes -gallbladder disease -headaches -heart disease or recent heart attack -high blood pressure -high cholesterol -kidney disease -liver disease -renal disease -seizures -tobacco smoker -an unusual or allergic reaction to etonogestrel, other hormones, anesthetics or antiseptics, medicines, foods, dyes, or preservatives -pregnant or trying to get pregnant -breast-feeding How should I use this medicine? This device is inserted just under the skin on the inner side of your upper arm by a health care professional. Talk to your pediatrician regarding the use of this medicine in children. Special care may be needed. Overdosage: If you think you have taken too much of this medicine contact a poison control center or emergency room at once. NOTE: This medicine is only for you. Do not share this medicine with others. What if I miss a dose? This does not apply. What may interact with this medicine? Do not take this medicine with any of the following medications: -amprenavir -bosentan -fosamprenavir This medicine may also interact with the following medications: -barbiturate medicines for inducing sleep or treating seizures -certain medicines for fungal infections like ketoconazole and itraconazole -griseofulvin -medicines to treat seizures like carbamazepine, felbamate, oxcarbazepine,  phenytoin, topiramate -modafinil -phenylbutazone -rifampin -some medicines to treat HIV infection like atazanavir, indinavir, lopinavir, nelfinavir, tipranavir, ritonavir -St. John's wort This list may not describe all possible interactions. Give your health care provider a list of all the medicines, herbs, non-prescription drugs, or dietary supplements you use. Also tell them if you smoke, drink alcohol, or use illegal drugs. Some items may interact with your medicine. What should I watch for while using this medicine? This product does not protect you against HIV infection (AIDS) or other sexually transmitted diseases. You should be able to feel the implant by pressing your fingertips over the skin where it was inserted. Contact your doctor if you cannot feel the implant, and use a non-hormonal birth control method (such as condoms) until your doctor confirms that the implant is in place. If you feel that the implant may have broken or become bent while in your arm, contact your healthcare provider. What side effects may I notice from receiving this medicine? Side effects that you should report to your doctor or health care professional as soon as possible: -allergic reactions like skin rash, itching or hives, swelling of the face, lips, or tongue -breast lumps -changes in emotions or moods -depressed mood -heavy or prolonged menstrual bleeding -pain, irritation, swelling, or bruising at the insertion site -scar at site of insertion -signs of infection at the insertion site such as fever, and skin redness, pain or discharge -signs of pregnancy -signs and symptoms of a blood clot such as breathing problems; changes in vision; chest pain; severe, sudden headache; pain, swelling, warmth in the leg; trouble speaking; sudden numbness or weakness of the face, arm or leg -signs and symptoms of liver injury like dark yellow or brown urine; general ill feeling or  flu-like symptoms; light-colored  stools; loss of appetite; nausea; right upper belly pain; unusually weak or tired; yellowing of the eyes or skin -unusual vaginal bleeding, discharge -signs and symptoms of a stroke like changes in vision; confusion; trouble speaking or understanding; severe headaches; sudden numbness or weakness of the face, arm or leg; trouble walking; dizziness; loss of balance or coordination Side effects that usually do not require medical attention (Report these to your doctor or health care professional if they continue or are bothersome.): -acne -back pain -breast pain -changes in weight -dizziness -general ill feeling or flu-like symptoms -headache -irregular menstrual bleeding -nausea -sore throat -vaginal irritation or inflammation This list may not describe all possible side effects. Call your doctor for medical advice about side effects. You may report side effects to FDA at 1-800-FDA-1088. Where should I keep my medicine? This drug is given in a hospital or clinic and will not be stored at home. NOTE: This sheet is a summary. It may not cover all possible information. If you have questions about this medicine, talk to your doctor, pharmacist, or health care provider.    2016, [employer]vier/Gold Standard. (2014-06-23 14:07:06)

## 2016-06-18 NOTE — Progress Notes (Signed)
Addendum- screening done 06/17/16 but abstracted 06/18/16

## 2016-06-18 NOTE — Progress Notes (Signed)
Addendum- screening done 06/17/16 abstracted 06/18/16

## 2016-07-10 ENCOUNTER — Encounter: Payer: Medicaid Other | Admitting: Obstetrics and Gynecology

## 2016-07-10 ENCOUNTER — Encounter: Payer: Self-pay | Admitting: *Deleted

## 2016-07-10 ENCOUNTER — Encounter: Payer: Self-pay | Admitting: Obstetrics and Gynecology

## 2016-07-10 NOTE — Progress Notes (Signed)
GYN Note Patient did not keep GYN colpo appointment for 07/10/2016. Inbasket message sent to staff to send letter.   Cornelia Copaharlie Johniya Durfee, Jr MD [facility]ending [facility](Faculty Practice)

## 2016-09-17 ENCOUNTER — Encounter: Payer: Medicaid Other | Admitting: Obstetrics and Gynecology

## 2016-09-17 ENCOUNTER — Encounter: Payer: Self-pay | Admitting: *Deleted

## 2016-09-17 NOTE — Progress Notes (Signed)
Joanna Melton was scheduled for a colposcopy and she DNKA. Will send her a letter notifying her she missed her appt.

## 2017-01-17 ENCOUNTER — Encounter (HOSPITAL_COMMUNITY): Payer: Self-pay

## 2017-01-17 ENCOUNTER — Emergency Department (HOSPITAL_COMMUNITY)
Admission: EM | Admit: 2017-01-17 | Discharge: 2017-01-17 | Disposition: A | Discharge status: Home / Self Care | Payer: Self-pay | Attending: Emergency Medicine | Admitting: Emergency Medicine

## 2017-01-17 DIAGNOSIS — F17210 Nicotine dependence, cigarettes, uncomplicated: Secondary | ICD-10-CM | POA: Insufficient documentation

## 2017-01-17 DIAGNOSIS — L03213 Periorbital cellulitis: Secondary | ICD-10-CM | POA: Insufficient documentation

## 2017-01-17 MED ORDER — CLINDAMYCIN HCL 150 MG PO CAPS
300.0000 mg | ORAL_CAPSULE | Freq: Once | ORAL | Status: AC
  Administered 2017-01-17: 300 mg via ORAL
  Filled 2017-01-17: qty 2

## 2017-01-17 MED ORDER — CLINDAMYCIN HCL 150 MG PO CAPS: 300.0000 mg | ORAL_CAPSULE | Freq: Three times a day (TID) | ORAL | 0 refills | Status: AC

## 2017-01-17 NOTE — ED Triage Notes (Signed)
Pt here for swelling, drainage and redness to left eye lid.

## 2017-01-17 NOTE — ED Notes (Signed)
Pt complains of pain and swelling to her left eye. Pt believes she might have been bit by something on her eye lid.

## 2017-01-17 NOTE — ED Provider Notes (Signed)
[facility]-EMERGENCY DEPT Provider Note   CSN: [id number] Arrival date & time: 01/17/17  2001 By signing my name below, I, [name], attest that this documentation has been prepared under the direction and in the presence of non physician provider, Melburn Hake, PA-C.  Electronically Signed: [name], Scribe. 01/17/2017. 9:24 PM  History   Chief Complaint Chief Complaint  Patient presents with  . Eye Problem   HPI Joanna Melton is a 31 y.o. female who presents to the Emergency Department complaining of progressively worsening swelling to her left upper eyelid onset yesterday. She reports associated itching and drainage to her left eye, lightheadedness, rhinorrhea, mild headache, nausea, and moderate pain to her left eye. Pt describes her left eye pain as aching fullness. Per pt, her symptoms began as a small, clear bump to her inner eyelid yesterday morning. Pt states she applied a warm compress to the area last night which caused her eye to drain white drainage. She has used Clear Eyes eye drops with no significant relief. Pt does not wear contacts. No recent injury. Pt denies any fever, visual disturbances, eye redness, congestion, abdominal pain, or  vomiting.   The history is provided by the patient. No language interpreter was used.   Past Medical History:  Diagnosis Date  . Anemia   . Anxiety   . Chlamydia   . Depression   . Medical history non-contributory     Patient Active Problem List   Diagnosis Date Noted  . Atypical squamous cell changes of undetermined significance (ASCUS) on cervical cytology with positive high risk human papilloma virus (HPV) 12/26/2015  . Marijuana use 12/26/2015  . Smoker 05/03/2013    Past Surgical History:  Procedure Laterality Date  . INDUCED ABORTION     OB History    Gravida Para Term Preterm AB Living   0 2 4   SAB TAB Ectopic Multiple Live Births   1 1 0 0 4     Home Medications    Prior to Admission  medications   Medication Sig Start Date End Date Taking? Authorizing Provider  acetaminophen (TYLENOL) 500 MG tablet Take 500 mg by mouth every 6 (six) hours as needed for moderate pain.     Historical Provider, MD  calcium carbonate (TUMS - DOSED IN MG ELEMENTAL CALCIUM) 500 MG chewable tablet Chew 1 tablet by mouth daily.    Historical Provider, MD  clindamycin (CLEOCIN) 150 MG capsule Take 2 capsules (300 mg total) by mouth 3 (three) times daily. May dispense as  capsules 01/17/17 01/27/17  Barrett Henle, PA-C  ibuprofen (ADVIL,MOTRIN) 600 MG tablet Take 1 tablet (600 mg total) by mouth every 6 (six) hours. 06/17/16   Dorathy Kinsman, CNM  oxyCODONE-acetaminophen (PERCOCET/ROXICET) 5-325 MG tablet Take 1 tablet by mouth every 8 (eight) hours as needed for moderate pain. 05/08/16   Elsia Rodolph Bong, DO  polyethylene glycol (MIRALAX / GLYCOLAX) packet Take 17 g by mouth 2 (two) times daily. 05/17/16   Donette Larry, CNM  Prenatal Vit-Fe Fumarate-FA (PRENATAL COMPLETE) 14-0.4 MG TABS Take 1 tablet by mouth daily. 10/20/15   Duane Lope, NP    Family History Family History  Problem Relation Age of Onset  . Hypertension Maternal Grandfather   . Diabetes Maternal Grandfather   . Hypertension Mother     Social History Social History  Substance Use Topics  . Smoking status: Current Every Day Smoker    Packs/day: 0.50    Years: 10.00  Types: Cigarettes  . Smokeless tobacco: Never Used     Comment: trying to cut back on smoking  . Alcohol use No     Comment: occasional     Allergies   Patient has no known allergies.   Review of Systems Review of Systems  Constitutional: Negative for fever.  HENT: Positive for rhinorrhea. Negative for congestion.   Eyes: Positive for pain, discharge and itching. Negative for redness and visual disturbance.  Gastrointestinal: Positive for nausea. Negative for abdominal pain and vomiting.  Neurological: Positive for dizziness,  light-headedness and headaches.   Physical Exam Updated Vital Signs BP (!) 136/93 (BP Location: Right Arm)   Pulse 79   Temp 98.5 F (36.9 C) (Oral)   Resp 20   Wt 59 kg   SpO2 100%   BMI 24.56 kg/m   Physical Exam  Constitutional: She is oriented to person, place, and time. She appears well-developed and well-nourished.  HENT:  Head: Normocephalic and atraumatic.  Mouth/Throat: Uvula is midline, oropharynx is clear and moist and mucous membranes are normal. No oropharyngeal exudate, posterior oropharyngeal edema, posterior oropharyngeal erythema or tonsillar abscesses. No tonsillar exudate.  Eyes: EOM are normal. Pupils are equal, round, and reactive to light. Lids are everted and swept, no foreign bodies found. Right eye exhibits no discharge. Left eye exhibits discharge. Left eye exhibits no chemosis, no exudate and no hordeolum. No foreign body present in the left eye. Left conjunctiva is injected. Left conjunctiva has no hemorrhage. No scleral icterus.  Moderate swelling present to left upper eyelid with purulent drainage present from medial upper eye lid. Visual acuity grossly intact. Reports mild pain with movement of left eye, however pt with full EOMI. No proptosis. No globe displacement.   Neck: Normal range of motion. Neck supple.  Pulmonary/Chest: Effort normal.  Musculoskeletal: Normal range of motion.  Neurological: She is alert and oriented to person, place, and time. No cranial nerve deficit or sensory deficit.  Skin: Skin is warm and dry.  Nursing note and vitals reviewed.  ED Treatments / Results  DIAGNOSTIC STUDIES:  Oxygen Saturation is 99% RA, normal by my interpretation.    COORDINATION OF CARE:  9:21 PM Discussed treatment plan which includes abx with pt at bedside and pt agreed to plan.  Labs (all labs ordered are listed, but only abnormal results are displayed) Labs Reviewed - No data to display  EKG  EKG Interpretation None      Radiology No  results found.  Procedures Procedures (including critical care time)  Medications Ordered in ED Medications  clindamycin (CLEOCIN) capsule 300 mg (not administered)     Initial Impression / Assessment and Plan / ED Course  I have reviewed the triage vital signs and the nursing notes.  Pertinent labs & imaging results that were available during my care of the patient were reviewed by me and considered in my medical decision making (see chart for details).     Pt presents with swelling and redness to left upper eyelid with associated eye pain and drainage. Denies use of contacts. Denies any injury to her eye. Denies fever. VSS. Exam revealed moderate swelling to left upper eyelid with purulent drainage present from medial upper eye lid. Reports pain with movement of left eye but left EOMI. No visual acuity abnormalities. No proptosis, or globe displacement. Presentation consistent with preseptal cellulitis. Exam does not appear concerning for orbital cellulitis, pt afebrile, nml vision. Plan to start pt on Clindamycin with outpatient follow up in  24-48 hours. Discussed strict return precautions with pt for sxs concerning for orbital cellulitis. Pt reports understanding and agreement.  Final Clinical Impressions(s) / ED Diagnoses   Final diagnoses:  Preseptal cellulitis    New Prescriptions New Prescriptions   CLINDAMYCIN (CLEOCIN) 150 MG CAPSULE    Take 2 capsules (300 mg total) by mouth 3 (three) times daily. May dispense as  capsules   I personally performed the services described in this documentation, which was scribed in my presence. The recorded information has been reviewed and is accurate.     Satira Sark [place], [place] 01/17/17 2219    Mancel Bale, MD 01/18/17 1226

## 2017-01-17 NOTE — Discharge Instructions (Signed)
Take your antibiotic as prescribed until completed. You may also take 600 mg ibuprofen every 6 hours as needed for pain relief. I also recommend continuing to apply warm compresses to affected area 15-20 minutes 3-4 times daily. Follow-up with the ophthalmology clinic listed below if your symptoms have not improved within the next 48 hours. Return to the emergency department if symptoms worsen over the next 24-48 hours or new onset of fever, spreading of swelling/redness, eye drainage, protrusion of eyeball, loss of vision, decreased range of motion of your eye.

## 2018-06-24 ENCOUNTER — Emergency Department (HOSPITAL_COMMUNITY)
Admission: EM | Admit: 2018-06-24 | Discharge: 2018-06-24 | Disposition: A | Discharge status: Home / Self Care | Payer: Self-pay | Attending: Emergency Medicine | Admitting: Emergency Medicine

## 2018-06-24 ENCOUNTER — Other Ambulatory Visit: Payer: Self-pay

## 2018-06-24 ENCOUNTER — Encounter (HOSPITAL_COMMUNITY): Payer: Self-pay | Admitting: *Deleted

## 2018-06-24 ENCOUNTER — Emergency Department (HOSPITAL_COMMUNITY): Payer: Self-pay

## 2018-06-24 DIAGNOSIS — F17210 Nicotine dependence, cigarettes, uncomplicated: Secondary | ICD-10-CM | POA: Insufficient documentation

## 2018-06-24 DIAGNOSIS — S60412A Abrasion of right middle finger, initial encounter: Secondary | ICD-10-CM | POA: Insufficient documentation

## 2018-06-24 DIAGNOSIS — Y929 Unspecified place or not applicable: Secondary | ICD-10-CM | POA: Insufficient documentation

## 2018-06-24 DIAGNOSIS — Y999 Unspecified external cause status: Secondary | ICD-10-CM | POA: Insufficient documentation

## 2018-06-24 DIAGNOSIS — Z79899 Other long term (current) drug therapy: Secondary | ICD-10-CM | POA: Insufficient documentation

## 2018-06-24 DIAGNOSIS — W228XXA Striking against or struck by other objects, initial encounter: Secondary | ICD-10-CM | POA: Insufficient documentation

## 2018-06-24 DIAGNOSIS — S6992XA Unspecified injury of left wrist, hand and finger(s), initial encounter: Secondary | ICD-10-CM

## 2018-06-24 DIAGNOSIS — Y939 Activity, unspecified: Secondary | ICD-10-CM | POA: Insufficient documentation

## 2018-06-24 LAB — PREGNANCY, URINE: Preg Test, Ur: NEGATIVE

## 2018-06-24 MED ORDER — HYDROCODONE-ACETAMINOPHEN 5-325 MG PO TABS
1.0000 | ORAL_TABLET | Freq: Once | ORAL | Status: AC
  Administered 2018-06-24: 1 via ORAL
  Filled 2018-06-24: qty 1

## 2018-06-24 MED ORDER — CEPHALEXIN 500 MG PO CAPS: 500.0000 mg | ORAL_CAPSULE | Freq: Two times a day (BID) | ORAL | 0 refills | Status: AC

## 2018-06-24 MED ORDER — NAPROXEN 500 MG PO TABS: 500.0000 mg | ORAL_TABLET | Freq: Two times a day (BID) | ORAL | 0 refills | Status: AC

## 2018-06-24 NOTE — ED Triage Notes (Signed)
Pin to Lt index finger. Pt reports finger injury occurred on WED. Morning.

## 2018-06-24 NOTE — Discharge Instructions (Addendum)
Evaluated today for finger pain.  Your x-ray was negative for fracture or dislocation.  Your pain is most likely musculoskeletal in nature.  Take medicines as prescribed.   Return to the ED if you notice any redness to the area, warmth, additional swelling, inability to bend at your fingers, numbness, tingling in your extremities fever, chills.  Contact a doctor if: Your pain is not helped by medicine. Your bruising or swelling gets worse. Your cast or splint is damaged. Your finger is numb or blue. Your finger feels colder than normal.

## 2018-06-24 NOTE — ED Triage Notes (Signed)
Pt reports she threw bucket and may have fractured her finger.
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
## 2018-06-24 NOTE — ED Notes (Signed)
Declined W/C at D/C and was escorted to lobby by RN. 

## 2018-07-19 ENCOUNTER — Encounter: Payer: Self-pay | Admitting: *Deleted

## 2019-01-20 ENCOUNTER — Encounter: Payer: Self-pay | Admitting: *Deleted

## 2021-03-29 ENCOUNTER — Other Ambulatory Visit: Payer: Self-pay

## 2021-03-29 ENCOUNTER — Ambulatory Visit (HOSPITAL_COMMUNITY)
Admission: EM | Admit: 2021-03-29 | Discharge: 2021-03-29 | Disposition: A | Discharge status: Home / Self Care | Payer: No Payment, Other | Attending: Urology | Admitting: Urology

## 2021-03-29 DIAGNOSIS — F1910 Other psychoactive substance abuse, uncomplicated: Secondary | ICD-10-CM | POA: Insufficient documentation

## 2021-03-29 DIAGNOSIS — F1410 Cocaine abuse, uncomplicated: Secondary | ICD-10-CM | POA: Insufficient documentation

## 2021-03-29 DIAGNOSIS — G8929 Other chronic pain: Secondary | ICD-10-CM | POA: Insufficient documentation

## 2021-03-29 DIAGNOSIS — F411 Generalized anxiety disorder: Secondary | ICD-10-CM | POA: Insufficient documentation

## 2021-03-29 DIAGNOSIS — M25512 Pain in left shoulder: Secondary | ICD-10-CM | POA: Insufficient documentation

## 2021-03-29 DIAGNOSIS — F1290 Cannabis use, unspecified, uncomplicated: Secondary | ICD-10-CM | POA: Insufficient documentation
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
## 2021-03-29 NOTE — Discharge Instructions (Addendum)
  Discharge recommendations:  Patient is to take medications as prescribed. Please see information for follow-up appointment with psychiatry and therapy. Please follow up with your primary care provider for all medical related needs.   Therapy: We recommend that patient participate in individual therapy to address mental health concerns. See printout schedule for therapy walk-in hours for [facility] here at [facility]  Medications: The parent/guardian is to contact a medical professional and/or outpatient provider to address any new side effects that develop. Parent/guardian should update outpatient providers of any new medications and/or medication changes.   Safety:  The patient should abstain from use of illicit substances/drugs and abuse of any medications. If symptoms worsen or do not continue to improve or if the patient becomes actively suicidal or homicidal then it is recommended that the patient return to the closest hospital emergency department, the [facility], or call 911 for further evaluation and treatment. National Suicide Prevention Lifeline 1-800-SUICIDE or 213 521 6569.
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
## 2022-05-28 ENCOUNTER — Other Ambulatory Visit: Payer: Self-pay

## 2022-05-28 ENCOUNTER — Encounter: Payer: Self-pay | Admitting: Family Medicine

## 2022-05-28 ENCOUNTER — Telehealth: Payer: Self-pay | Admitting: Family Medicine

## 2022-05-28 NOTE — Telephone Encounter (Signed)
Called patient to give her estimate for nexplanon removal, there was no answer to the phone call and it said number invalid. An alternate number was also tried but there was no answer for that number either.

## 2022-06-02 ENCOUNTER — Encounter: Payer: Self-pay | Admitting: Advanced Practice Midwife
